# Patient Record
Sex: Female | Born: 1975 | Race: White | Hispanic: No | Marital: Married | Smoking: Never smoker
Health system: Southern US, Community
[De-identification: ages and names within clinical notes are randomized; demographics above are authoritative.]

## PROBLEM LIST (undated history)

## (undated) DIAGNOSIS — B019 Varicella without complication: Secondary | ICD-10-CM

## (undated) HISTORY — PX: AUGMENTATION MAMMAPLASTY: SUR837

## (undated) HISTORY — DX: Varicella without complication: B01.9

---

## 2004-09-12 HISTORY — PX: DEBRIDEMENT AND CLOSURE WOUND: SHX5614

## 2005-07-13 HISTORY — PX: DILATION AND CURETTAGE OF UTERUS: SHX78

## 2005-09-12 HISTORY — PX: TUBAL LIGATION: SHX77

## 2009-09-12 HISTORY — PX: BREAST SURGERY: SHX581

## 2017-02-22 DIAGNOSIS — N920 Excessive and frequent menstruation with regular cycle: Secondary | ICD-10-CM | POA: Diagnosis not present

## 2017-02-22 DIAGNOSIS — Z01419 Encounter for gynecological examination (general) (routine) without abnormal findings: Secondary | ICD-10-CM | POA: Diagnosis not present

## 2018-02-01 DIAGNOSIS — K080 Exfoliation of teeth due to systemic causes: Secondary | ICD-10-CM | POA: Diagnosis not present

## 2018-04-24 ENCOUNTER — Encounter: Payer: Self-pay | Admitting: Physician Assistant

## 2018-04-24 ENCOUNTER — Other Ambulatory Visit: Payer: Self-pay

## 2018-04-24 ENCOUNTER — Ambulatory Visit (INDEPENDENT_AMBULATORY_CARE_PROVIDER_SITE_OTHER): Payer: Federal, State, Local not specified - PPO | Admitting: Physician Assistant

## 2018-04-24 VITALS — BP 102/72 | HR 61 | Temp 98.0°F | Resp 16 | Ht 62.0 in | Wt 92.0 lb

## 2018-04-24 DIAGNOSIS — Z0000 Encounter for general adult medical examination without abnormal findings: Secondary | ICD-10-CM

## 2018-04-24 DIAGNOSIS — Z1231 Encounter for screening mammogram for malignant neoplasm of breast: Secondary | ICD-10-CM

## 2018-04-24 DIAGNOSIS — Z1239 Encounter for other screening for malignant neoplasm of breast: Secondary | ICD-10-CM

## 2018-04-24 LAB — LIPID PANEL
CHOL/HDL RATIO: 3
Cholesterol: 157 mg/dL (ref 0–200)
HDL: 56 mg/dL (ref >39.00)
LDL CALC: 90 mg/dL (ref 0–99)
NONHDL: 100.97
TRIGLYCERIDES: 56 mg/dL (ref 0.0–149.0)
VLDL: 11.2 mg/dL (ref 0.0–40.0)

## 2018-04-24 LAB — CBC WITH DIFFERENTIAL/PLATELET
BASOS ABS: 0.1 10*3/uL (ref 0.0–0.1)
BASOS PCT: 0.9 % (ref 0.0–3.0)
Eosinophils Absolute: 0.1 10*3/uL (ref 0.0–0.7)
Eosinophils Relative: 1 % (ref 0.0–5.0)
HEMATOCRIT: 39.1 % (ref 36.0–46.0)
Hemoglobin: 13.4 g/dL (ref 12.0–15.0)
LYMPHS ABS: 1.9 10*3/uL (ref 0.7–4.0)
Lymphocytes Relative: 31.7 % (ref 12.0–46.0)
MCHC: 34.3 g/dL (ref 30.0–36.0)
MCV: 84.8 fl (ref 78.0–100.0)
MONOS PCT: 6.6 % (ref 3.0–12.0)
Monocytes Absolute: 0.4 10*3/uL (ref 0.1–1.0)
NEUTROS PCT: 59.8 % (ref 43.0–77.0)
Neutro Abs: 3.5 10*3/uL (ref 1.4–7.7)
Platelets: 271 10*3/uL (ref 150.0–400.0)
RBC: 4.61 Mil/uL (ref 3.87–5.11)
RDW: 13.3 % (ref 11.5–15.5)
WBC: 5.9 10*3/uL (ref 4.0–10.5)

## 2018-04-24 LAB — COMPREHENSIVE METABOLIC PANEL
ALT: 10 U/L (ref 0–35)
AST: 13 U/L (ref 0–37)
Albumin: 4.9 g/dL (ref 3.5–5.2)
Alkaline Phosphatase: 36 U/L — ABNORMAL LOW (ref 39–117)
BILIRUBIN TOTAL: 0.7 mg/dL (ref 0.2–1.2)
BUN: 15 mg/dL (ref 6–23)
CALCIUM: 10.1 mg/dL (ref 8.4–10.5)
CO2: 29 meq/L (ref 19–32)
Chloride: 103 mEq/L (ref 96–112)
Creatinine, Ser: 0.73 mg/dL (ref 0.40–1.20)
GFR: 92.78 mL/min (ref >60.00)
GLUCOSE: 92 mg/dL (ref 70–99)
POTASSIUM: 4 meq/L (ref 3.5–5.1)
Sodium: 138 mEq/L (ref 135–145)
Total Protein: 7.2 g/dL (ref 6.0–8.3)

## 2018-04-24 NOTE — Progress Notes (Signed)
Patient presents to clinic today to establish care. Is overdue for physical. Endorses well-balanced diet overall. Keeps active. Denies acute concerns today.   Health Maintenance: Immunizations -- up-to-date.  Mammogram -- Overdue. Asymptomatic. Has implants.  PAP -- 01/2017. Normal per patient.    Past Medical History:  Diagnosis Date  . Chicken pox     Past Surgical History:  Procedure Laterality Date  . BREAST SURGERY  2011   Augmentation  . CESAREAN SECTION  2007  . DEBRIDEMENT AND CLOSURE WOUND  2006  . TUBAL LIGATION  2007    No current outpatient medications on file prior to visit.   No current facility-administered medications on file prior to visit.     Allergies  Allergen Reactions  . Penicillins Hives  . Sulfa Antibiotics Hives    Family History  Problem Relation Age of Onset  . Heart attack Father   . Hyperlipidemia Sister   . Hypertension Sister   . Cancer Maternal Grandmother   . Cancer Maternal Grandfather   . Hearing loss Paternal Grandmother   . Heart attack Paternal Grandmother   . Hearing loss Paternal Grandfather   . Heart attack Paternal Grandfather     Social History   Socioeconomic History  . Marital status: Married    Spouse name: Not on file  . Number of children: Not on file  . Years of education: Not on file  . Highest education level: Not on file  Occupational History  . Not on file  Social Needs  . Financial resource strain: Not on file  . Food insecurity:    Worry: Not on file    Inability: Not on file  . Transportation needs:    Medical: Not on file    Non-medical: Not on file  Tobacco Use  . Smoking status: Never Smoker  . Smokeless tobacco: Never Used  Substance and Sexual Activity  . Alcohol use: Never    Frequency: Never  . Drug use: Never  . Sexual activity: Yes  Lifestyle  . Physical activity:    Days per week: Not on file    Minutes per session: Not on file  . Stress: Not on file  Relationships  .  Social connections:    Talks on phone: Not on file    Gets together: Not on file    Attends religious service: Not on file    Active member of club or organization: Not on file    Attends meetings of clubs or organizations: Not on file    Relationship status: Not on file  . Intimate partner violence:    Fear of current or ex partner: Not on file    Emotionally abused: Not on file    Physically abused: Not on file    Forced sexual activity: Not on file  Other Topics Concern  . Not on file  Social History Narrative  . Not on file   Review of Systems  Constitutional: Negative for fever and weight loss.  HENT: Negative for ear discharge, ear pain, hearing loss and tinnitus.   Eyes: Negative for blurred vision, double vision, photophobia and pain.  Respiratory: Negative for cough and shortness of breath.   Cardiovascular: Negative for chest pain and palpitations.  Gastrointestinal: Negative for abdominal pain, blood in stool, constipation, diarrhea, heartburn, melena, nausea and vomiting.  Genitourinary: Negative for dysuria, flank pain, frequency, hematuria and urgency.  Musculoskeletal: Negative for falls.  Neurological: Negative for dizziness, loss of consciousness and headaches.  Endo/Heme/Allergies: Negative  for environmental allergies.  Psychiatric/Behavioral: Negative for depression, hallucinations, substance abuse and suicidal ideas. The patient is not nervous/anxious and does not have insomnia.    BP 102/72   Pulse 61   Temp 98 F (36.7 C) (Oral)   Resp 16   Ht 5\' 2"  (1.575 m)   Wt 92 lb (41.7 kg)   LMP 04/03/2018   SpO2 99%   BMI 16.83 kg/m   Physical Exam  Constitutional: She is oriented to person, place, and time. She appears well-developed and well-nourished.  HENT:  Head: Normocephalic and atraumatic.  Right Ear: Tympanic membrane, external ear and ear canal normal.  Left Ear: Tympanic membrane, external ear and ear canal normal.  Nose: Nose normal. No mucosal  edema.  Mouth/Throat: Uvula is midline, oropharynx is clear and moist and mucous membranes are normal. No oropharyngeal exudate or posterior oropharyngeal erythema.  Eyes: Pupils are equal, round, and reactive to light. Conjunctivae and EOM are normal.  Neck: Neck supple. No thyromegaly present.  Cardiovascular: Normal rate, regular rhythm, normal heart sounds and intact distal pulses.  Pulmonary/Chest: Effort normal and breath sounds normal. No respiratory distress. She has no wheezes. She has no rales.  Abdominal: Soft. Bowel sounds are normal. She exhibits no distension and no mass. There is no tenderness. There is no rebound and no guarding.  Lymphadenopathy:    She has no cervical adenopathy.  Neurological: She is alert and oriented to person, place, and time. No cranial nerve deficit. She exhibits normal muscle tone. Coordination normal.  Skin: Skin is warm and dry. No rash noted.  Vitals reviewed.  Assessment/Plan: 1. Visit for preventive health examination Depression screen negative. Health Maintenance reviewed. Preventive schedule discussed and handout given in AVS. Will obtain fasting labs today.  - CBC with Differential/Platelet - Comprehensive metabolic panel - Lipid panel  2. Breast cancer screening Order for screening mammogram placed.  - MM DIGITAL SCREENING BILATERAL; Future   Piedad ClimesWilliam Cody Devine Dant, PA-C

## 2018-04-24 NOTE — Patient Instructions (Signed)
Please go to the lab for blood work.   Our office will call you with your results unless you have chosen to receive results via MyChart.  If your blood work is normal we will follow-up each year for physicals and as scheduled for chronic medical problems.  If anything is abnormal we will treat accordingly and get you in for a follow-up.  Start a daily Turmeric supplement (500 mg) Consider getting a thumb spica splint/brace at [facility] or the pharmacy to wear on off days to help calm down inflammation. Let me know if things are not improving.   Preventive Care 40-64 Years, Female Preventive care refers to lifestyle choices and visits with your health care provider that can promote health and wellness. What does preventive care include?  A yearly physical exam. This is also called an annual well check.  Dental exams once or twice a year.  Routine eye exams. Ask your health care provider how often you should have your eyes checked.  Personal lifestyle choices, including: ? Daily care of your teeth and gums. ? Regular physical activity. ? Eating a healthy diet. ? Avoiding tobacco and drug use. ? Limiting alcohol use. ? Practicing safe sex. ? Taking low-dose aspirin daily starting at age 42. ? Taking vitamin and mineral supplements as recommended by your health care provider. What happens during an annual well check? The services and screenings done by your health care provider during your annual well check will depend on your age, overall health, lifestyle risk factors, and family history of disease. Counseling Your health care provider may ask you questions about your:  Alcohol use.  Tobacco use.  Drug use.  Emotional well-being.  Home and relationship well-being.  Sexual activity.  Eating habits.  Work and work Statistician.  Method of birth control.  Menstrual cycle.  Pregnancy history.  Screening You may have the following tests or measurements:  Height,  weight, and BMI.  Blood pressure.  Lipid and cholesterol levels. These may be checked every 5 years, or more frequently if you are over 37 years old.  Skin check.  Lung cancer screening. You may have this screening every year starting at age 35 if you have a 30-pack-year history of smoking and currently smoke or have quit within the past 15 years.  Fecal occult blood test (FOBT) of the stool. You may have this test every year starting at age 60.  Flexible sigmoidoscopy or colonoscopy. You may have a sigmoidoscopy every 5 years or a colonoscopy every 10 years starting at age 38.  Hepatitis C blood test.  Hepatitis B blood test.  Sexually transmitted disease (STD) testing.  Diabetes screening. This is done by checking your blood sugar (glucose) after you have not eaten for a while (fasting). You may have this done every 1-3 years.  Mammogram. This may be done every 1-2 years. Talk to your health care provider about when you should start having regular mammograms. This may depend on whether you have a family history of breast cancer.  BRCA-related cancer screening. This may be done if you have a family history of breast, ovarian, tubal, or peritoneal cancers.  Pelvic exam and Pap test. This may be done every 3 years starting at age 64. Starting at age 35, this may be done every 5 years if you have a Pap test in combination with an HPV test.  Bone density scan. This is done to screen for osteoporosis. You may have this scan if you are at high risk  for osteoporosis.  Discuss your test results, treatment options, and if necessary, the need for more tests with your health care provider. Vaccines Your health care provider may recommend certain vaccines, such as:  Influenza vaccine. This is recommended every year.  Tetanus, diphtheria, and acellular pertussis (Tdap, Td) vaccine. You may need a Td booster every 10 years.  Varicella vaccine. You may need this if you have not been  vaccinated.  Zoster vaccine. You may need this after age 59.  Measles, mumps, and rubella (MMR) vaccine. You may need at least one dose of MMR if you were born in 1957 or later. You may also need a second dose.  Pneumococcal 13-valent conjugate (PCV13) vaccine. You may need this if you have certain conditions and were not previously vaccinated.  Pneumococcal polysaccharide (PPSV23) vaccine. You may need one or two doses if you smoke cigarettes or if you have certain conditions.  Meningococcal vaccine. You may need this if you have certain conditions.  Hepatitis A vaccine. You may need this if you have certain conditions or if you travel or work in places where you may be exposed to hepatitis A.  Hepatitis B vaccine. You may need this if you have certain conditions or if you travel or work in places where you may be exposed to hepatitis B.  Haemophilus influenzae type b (Hib) vaccine. You may need this if you have certain conditions.  Talk to your health care provider about which screenings and vaccines you need and how often you need them. This information is not intended to replace advice given to you by your health care provider. Make sure you discuss any questions you have with your health care provider. Document Released: 09/25/2015 Document Revised: 05/18/2016 Document Reviewed: 06/30/2015 Elsevier Interactive Patient Education  [employer].  .

## 2018-04-25 ENCOUNTER — Encounter: Payer: Self-pay | Admitting: Emergency Medicine

## 2018-05-24 ENCOUNTER — Ambulatory Visit
Admission: RE | Admit: 2018-05-24 | Discharge: 2018-05-24 | Disposition: A | Discharge status: Home / Self Care | Payer: Federal, State, Local not specified - PPO | Source: Ambulatory Visit | Attending: Physician Assistant | Admitting: Physician Assistant

## 2018-05-24 DIAGNOSIS — Z1231 Encounter for screening mammogram for malignant neoplasm of breast: Secondary | ICD-10-CM | POA: Diagnosis not present

## 2018-05-24 DIAGNOSIS — Z1239 Encounter for other screening for malignant neoplasm of breast: Secondary | ICD-10-CM

## 2018-05-28 ENCOUNTER — Other Ambulatory Visit: Payer: Self-pay | Admitting: Physician Assistant

## 2018-05-28 DIAGNOSIS — R928 Other abnormal and inconclusive findings on diagnostic imaging of breast: Secondary | ICD-10-CM

## 2018-06-06 ENCOUNTER — Other Ambulatory Visit: Payer: Self-pay | Admitting: Physician Assistant

## 2018-06-06 ENCOUNTER — Ambulatory Visit
Admission: RE | Admit: 2018-06-06 | Discharge: 2018-06-06 | Disposition: A | Discharge status: Home / Self Care | Payer: Federal, State, Local not specified - PPO | Source: Ambulatory Visit | Attending: Physician Assistant | Admitting: Physician Assistant

## 2018-06-06 DIAGNOSIS — R921 Mammographic calcification found on diagnostic imaging of breast: Secondary | ICD-10-CM | POA: Diagnosis not present

## 2018-06-06 DIAGNOSIS — R928 Other abnormal and inconclusive findings on diagnostic imaging of breast: Secondary | ICD-10-CM

## 2018-06-12 ENCOUNTER — Ambulatory Visit
Admission: RE | Admit: 2018-06-12 | Discharge: 2018-06-12 | Disposition: A | Discharge status: Home / Self Care | Payer: Federal, State, Local not specified - PPO | Source: Ambulatory Visit | Attending: Physician Assistant | Admitting: Physician Assistant

## 2018-06-12 DIAGNOSIS — R928 Other abnormal and inconclusive findings on diagnostic imaging of breast: Secondary | ICD-10-CM

## 2018-06-21 ENCOUNTER — Other Ambulatory Visit: Payer: Self-pay | Admitting: General Surgery

## 2018-06-21 DIAGNOSIS — Z9851 Tubal ligation status: Secondary | ICD-10-CM | POA: Diagnosis not present

## 2018-06-21 DIAGNOSIS — R928 Other abnormal and inconclusive findings on diagnostic imaging of breast: Secondary | ICD-10-CM

## 2018-06-21 DIAGNOSIS — Z9882 Breast implant status: Secondary | ICD-10-CM | POA: Diagnosis not present

## 2018-06-21 DIAGNOSIS — Z98891 History of uterine scar from previous surgery: Secondary | ICD-10-CM | POA: Diagnosis not present

## 2018-07-09 NOTE — Pre-Procedure Instructions (Signed)
Courtney Key  07/09/2018      [id number] - [place], [place] - [address] [place] [address] Phone: [phone] Fax: [phone]    Your procedure is scheduled on Mon. Nov. 4, 2019 from 7:30AM-8:30AM  Report to [facility] Admitting Entrance "A" at 5:30AM  Call this number if you have problems the morning of surgery:  [phone]   Remember:  Do not eat after midnight on Nov.3rd  You may drink clear liquids until 3 hours (4:30AM) prior to surgery.  Clear liquids allowed are:   Water and Gatorade    Take these medicines the morning of surgery with A SIP OF WATER: NONE  As of today, stop taking all Other Aspirin Products, Vitamins, Fish oils, and Herbal medications. Also stop all NSAIDS i.e. Advil, Ibuprofen, Motrin, Aleve, Anaprox, Naproxen, BC, Goody Powders, and all Supplements.    Do not wear jewelry, make-up or nail polish.  Do not wear lotions, powders, or perfumes, or deodorant.  Do not shave 48 hours prior to surgery.   Do not bring valuables to the hospital.  [facility] is not responsible for any belongings or valuables.  Contacts, dentures or bridgework may not be worn into surgery.  Leave your suitcase in the car.  After surgery it may be brought to your room.  For patients admitted to the hospital, discharge time will be determined by your treatment team.  Patients discharged the day of surgery will not be allowed to drive home.   Special instructions:   [facility]- Preparing For Surgery  Before surgery, you can play an important role. Because skin is not sterile, your skin needs to be as free of germs as possible. You can reduce the number of germs on your skin by washing with CHG (chlorahexidine gluconate) Soap before surgery.  CHG is an antiseptic cleaner which kills germs and bonds with the skin to continue killing germs even after washing.    Oral Hygiene is also important to reduce your risk of  infection.  Remember - BRUSH YOUR TEETH THE MORNING OF SURGERY WITH YOUR REGULAR TOOTHPASTE  Please do not use if you have an allergy to CHG or antibacterial soaps. If your skin becomes reddened/irritated stop using the CHG.  Do not shave (including legs and underarms) for at least 48 hours prior to first CHG shower. It is OK to shave your face.  Please follow these instructions carefully.   1. Shower the NIGHT BEFORE SURGERY and the MORNING OF SURGERY with CHG.   2. If you chose to wash your hair, wash your hair first as usual with your normal shampoo.  3. After you shampoo, rinse your hair and body thoroughly to remove the shampoo.  4. Use CHG as you would any other liquid soap. You can apply CHG directly to the skin and wash gently with a scrungie or a clean washcloth.   5. Apply the CHG Soap to your body ONLY FROM THE NECK DOWN.  Do not use on open wounds or open sores. Avoid contact with your eyes, ears, mouth and genitals (private parts). Wash Face and genitals (private parts)  with your normal soap.  6. Wash thoroughly, paying special attention to the area where your surgery will be performed.  7. Thoroughly rinse your body with warm water from the neck down.  8. DO NOT shower/wash with your normal soap after using and rinsing off the CHG Soap.  9. Pat yourself dry  with a CLEAN TOWEL.  10. Wear CLEAN PAJAMAS to bed the night before surgery, wear comfortable clothes the morning of surgery  11. Place CLEAN SHEETS on your bed the night of your first shower and DO NOT SLEEP WITH PETS.  Day of Surgery:  Do not apply any deodorants/lotions.  Please wear clean clothes to the hospital/surgery center.   Remember to brush your teeth WITH YOUR REGULAR TOOTHPASTE.  Please read over the following fact sheets that you were given. Pain Booklet, Coughing and Deep Breathing and Surgical Site Infection Prevention

## 2018-07-10 ENCOUNTER — Encounter (HOSPITAL_COMMUNITY): Payer: Self-pay

## 2018-07-10 ENCOUNTER — Other Ambulatory Visit: Payer: Self-pay

## 2018-07-10 ENCOUNTER — Encounter (HOSPITAL_COMMUNITY)
Admission: RE | Admit: 2018-07-10 | Discharge: 2018-07-10 | Disposition: A | Discharge status: Home / Self Care | Payer: Federal, State, Local not specified - PPO | Source: Ambulatory Visit | Attending: General Surgery | Admitting: General Surgery

## 2018-07-10 DIAGNOSIS — Z01812 Encounter for preprocedural laboratory examination: Secondary | ICD-10-CM | POA: Insufficient documentation

## 2018-07-10 LAB — CBC WITH DIFFERENTIAL/PLATELET
ABS IMMATURE GRANULOCYTES: 0.02 10*3/uL (ref 0.00–0.07)
BASOS PCT: 1 %
Basophils Absolute: 0.1 10*3/uL (ref 0.0–0.1)
Eosinophils Absolute: 0.1 10*3/uL (ref 0.0–0.5)
Eosinophils Relative: 1 %
HEMATOCRIT: 39.3 % (ref 36.0–46.0)
Hemoglobin: 12.5 g/dL (ref 12.0–15.0)
Immature Granulocytes: 0 %
LYMPHS ABS: 2.2 10*3/uL (ref 0.7–4.0)
Lymphocytes Relative: 29 %
MCH: 28 pg (ref 26.0–34.0)
MCHC: 31.8 g/dL (ref 30.0–36.0)
MCV: 87.9 fL (ref 80.0–100.0)
MONO ABS: 0.5 10*3/uL (ref 0.1–1.0)
MONOS PCT: 7 %
NEUTROS ABS: 4.7 10*3/uL (ref 1.7–7.7)
Neutrophils Relative %: 62 %
PLATELETS: 270 10*3/uL (ref 150–400)
RBC: 4.47 MIL/uL (ref 3.87–5.11)
RDW: 12.8 % (ref 11.5–15.5)
WBC: 7.6 10*3/uL (ref 4.0–10.5)
nRBC: 0 % (ref 0.0–0.2)

## 2018-07-10 LAB — COMPREHENSIVE METABOLIC PANEL
ALK PHOS: 41 U/L (ref 38–126)
ALT: 13 U/L (ref 0–44)
AST: 19 U/L (ref 15–41)
Albumin: 4.3 g/dL (ref 3.5–5.0)
Anion gap: 8 (ref 5–15)
BILIRUBIN TOTAL: 0.4 mg/dL (ref 0.3–1.2)
BUN: 9 mg/dL (ref 6–20)
CALCIUM: 9.5 mg/dL (ref 8.9–10.3)
CHLORIDE: 105 mmol/L (ref 98–111)
CO2: 26 mmol/L (ref 22–32)
Creatinine, Ser: 0.6 mg/dL (ref 0.44–1.00)
GFR calc Af Amer: >60 mL/min (ref >60)
GLUCOSE: 97 mg/dL (ref 70–99)
Potassium: 3.6 mmol/L (ref 3.5–5.1)
Sodium: 139 mmol/L (ref 135–145)
TOTAL PROTEIN: 6.8 g/dL (ref 6.5–8.1)

## 2018-07-10 NOTE — Progress Notes (Signed)
PCP - Marcelline Mates, PA-C Cardiologist - denies seeing cardiology  Chest x-ray - N/A EKG -N/A  Stress Test - denies  ECHO - denies Cardiac Cath - denies  Sleep Study - patient denies having had sleep study.  Aspirin Instructions: pt instructed to stop all ASA/NSAIDs 7 days prior to surgery  Anesthesia review: no  Patient denies shortness of breath, fever, cough and chest pain at PAT appointment   Patient verbalized understanding of instructions that were given to them at the PAT appointment. Patient was also instructed that they will need to review over the PAT instructions again at home before surgery.

## 2018-07-10 NOTE — Pre-Procedure Instructions (Addendum)
Courtney Key  07/10/2018      [id number] - [place], [place] - [address] [place] [address] Phone: [phone] Fax: [phone]    Your procedure is scheduled on Mon. Nov. 4, 2019.  Report to [facility] Admitting Entrance "A" at 5:30AM  Call this number if you have problems the morning of surgery:  [phone]   Remember:  Do not eat after midnight on Nov.3rd  You may drink clear liquids until 3 hours (4:30AM) prior to surgery.  Clear liquids allowed are:   Water, Carbonated beverages, Clear tea, BLACK coffee (no cream or sugar) and Gatorade.     As of today, stop taking all Other Aspirin Products, Vitamins, Fish oils, and Herbal medications. Also stop all NSAIDS i.e. Advil, Ibuprofen, Motrin, Aleve, Anaprox, Naproxen, BC, Goody Powders, and all Supplements.    Do not wear jewelry, make-up or nail polish.  Do not wear lotions, powders, or perfumes, or deodorant.  Do not shave 48 hours prior to surgery.   Do not bring valuables to the hospital.  [facility] is not responsible for any belongings or valuables.  Contacts, dentures or bridgework may not be worn into surgery.  Leave your suitcase in the car.  After surgery it may be brought to your room.  For patients admitted to the hospital, discharge time will be determined by your treatment team.  Patients discharged the day of surgery will not be allowed to drive home.   Special instructions:   [place]- Preparing For Surgery  Before surgery, you can play an important role. Because skin is not sterile, your skin needs to be as free of germs as possible. You can reduce the number of germs on your skin by washing with CHG (chlorahexidine gluconate) Soap before surgery.  CHG is an antiseptic cleaner which kills germs and bonds with the skin to continue killing germs even after washing.    Oral Hygiene is also important to reduce your risk of infection.  Remember -  BRUSH YOUR TEETH THE MORNING OF SURGERY WITH YOUR REGULAR TOOTHPASTE  Please do not use if you have an allergy to CHG or antibacterial soaps. If your skin becomes reddened/irritated stop using the CHG.  Do not shave (including legs and underarms) for at least 48 hours prior to first CHG shower. It is OK to shave your face.  Please follow these instructions carefully.   1. Shower the NIGHT BEFORE SURGERY and the MORNING OF SURGERY with CHG.   2. If you chose to wash your hair, wash your hair first as usual with your normal shampoo.  3. After you shampoo, rinse your hair and body thoroughly to remove the shampoo.  4. Use CHG as you would any other liquid soap. You can apply CHG directly to the skin and wash gently with a scrungie or a clean washcloth.   5. Apply the CHG Soap to your body ONLY FROM THE NECK DOWN.  Do not use on open wounds or open sores. Avoid contact with your eyes, ears, mouth and genitals (private parts). Wash Face and genitals (private parts)  with your normal soap.  6. Wash thoroughly, paying special attention to the area where your surgery will be performed.  7. Thoroughly rinse your body with warm water from the neck down.  8. DO NOT shower/wash with your normal soap after using and rinsing off the CHG Soap.  9. Pat yourself dry with a CLEAN TOWEL.  10. Wear CLEAN PAJAMAS to bed the night before surgery, wear comfortable clothes the morning of surgery  11. Place CLEAN SHEETS on your bed the night of your first shower and DO NOT SLEEP WITH PETS.  Day of Surgery:  Do not apply any deodorants/lotions.  Please wear clean clothes to the hospital/surgery center.   Remember to brush your teeth WITH YOUR REGULAR TOOTHPASTE.  Please read over the following fact sheets that you were given. Pain Booklet, Coughing and Deep Breathing and Surgical Site Infection Prevention

## 2018-07-13 ENCOUNTER — Ambulatory Visit
Admission: RE | Admit: 2018-07-13 | Discharge: 2018-07-13 | Disposition: A | Discharge status: Home / Self Care | Payer: Federal, State, Local not specified - PPO | Source: Ambulatory Visit | Attending: General Surgery | Admitting: General Surgery

## 2018-07-13 DIAGNOSIS — R921 Mammographic calcification found on diagnostic imaging of breast: Secondary | ICD-10-CM | POA: Diagnosis not present

## 2018-07-13 DIAGNOSIS — R928 Other abnormal and inconclusive findings on diagnostic imaging of breast: Secondary | ICD-10-CM

## 2018-07-15 NOTE — Anesthesia Preprocedure Evaluation (Addendum)
Anesthesia Evaluation  Patient identified by MRN, date of birth, ID band Patient awake    Reviewed: Allergy & Precautions, H&P , NPO status , Patient's Chart, lab work & pertinent test results  Airway Mallampati: II  TM Distance: >3 FB Neck ROM: Full    Dental no notable dental hx. (+) Teeth Intact, Dental Advisory Given   Pulmonary neg pulmonary ROS,    Pulmonary exam normal breath sounds clear to auscultation       Cardiovascular Exercise Tolerance: Good negative cardio ROS   Rhythm:Regular Rate:Normal     Neuro/Psych negative neurological ROS  negative psych ROS   GI/Hepatic negative GI ROS, Neg liver ROS,   Endo/Other  negative endocrine ROS  Renal/GU negative Renal ROS  negative genitourinary   Musculoskeletal   Abdominal   Peds  Hematology negative hematology ROS (+)   Anesthesia Other Findings   Reproductive/Obstetrics negative OB ROS                            Anesthesia Physical Anesthesia Plan  ASA: I  Anesthesia Plan: General   Post-op Pain Management:    Induction: Intravenous  PONV Risk Score and Plan: 4 or greater and Ondansetron, Dexamethasone and Midazolam  Airway Management Planned: LMA  Additional Equipment:   Intra-op Plan:   Post-operative Plan: Extubation in OR  Informed Consent: I have reviewed the patients History and Physical, chart, labs and discussed the procedure including the risks, benefits and alternatives for the proposed anesthesia with the patient or authorized representative who has indicated his/her understanding and acceptance.   Dental advisory given  Plan Discussed with: CRNA  Anesthesia Plan Comments:         Anesthesia Quick Evaluation

## 2018-07-15 NOTE — H&P (Signed)
Courtney Key Location: [facility] Patient #: [id number] DOB: 04-May-1976 Married / Language: English / Race: White Female       History of Present Illness        This is a pleasant, healthy, 42 year old female from [place]. She is referred by Britta Mccreedy at the [facility] or calcifications left breast. Courtney Mates, PA provides primary care.      She has no prior history of breast problems. She does have a history of bilateral retropectoral implants. First ever screening mammogram showed 2 areas of calcification. In the left breast, 6 to 7 o'clock position there is a 1.1 cm area of suspicious microcalcifications. Also noted was some benign appearing subareolar calcifications which were felt to be appropriate for 6 month follow-up unless we find cancer in the lower inner quadrant. They were setting her up for stereo biopsy but could not do this due to the relatively thin breast tissue.      Past history reveals C-section 1. Multiparity. Augmentation mammoplasty in [place]. Bilateral tubal ligation. Family history reveals lung cancer in 2 grandparents. No breast or ovarian cancer. Mother and father living. Father has heart disease Social history reveals she is married with 4 children. Denies alcohol or tobacco. Lives in [place]. Works at the [place] [employer]. Her husband also is a Consulting civil engineer at another post office.      I told her that the calcifications in the lower inner quadrant of the left breast were suspicious but more likely they are benign. I told her that tissue diagnosis was recommended strongly. She agrees and she will be scheduled for left breast lumpectomy with radioactive seed localization. We'll use the cautery extensively to avoid injury to the implant. I discussed the indications, details, techniques, and numerous risk of the surgery with her. She is aware the risks of bleeding, infection, injury and rupture of the implant,  cosmetic deformity, nerve damage with chronic pain or numbness. Reoperation if cancer is found. She understands all of these issues. All her questions were answered. She agrees with this plan.    Past Surgical History Breast Augmentation  Bilateral. Cesarean Section - 1   Diagnostic Studies History  Colonoscopy  never Mammogram  within last year Pap Smear  1-5 years ago  Allergies  Penicillins  Sulfa Antibiotics  Allergies Reconciled   Medication History  No Current Medications Medications Reconciled  Social History  Alcohol use  Occasional alcohol use. Caffeine use  Carbonated beverages, Coffee. No drug use  Tobacco use  Never smoker.  Family History Heart Disease  Father.  Pregnancy / Birth History  Age at menarche  14 years. Contraceptive History  Oral contraceptives. Gravida  6 Maternal age  73-20 Para  4 Regular periods   Other Problems  No pertinent past medical history     Review of Systems  General Not Present- Appetite Loss, Chills, Fatigue, Fever, Night Sweats, Weight Gain and Weight Loss. Skin Not Present- Change in Wart/Mole, Dryness, Hives, Jaundice, New Lesions, Non-Healing Wounds, Rash and Ulcer. HEENT Not Present- Earache, Hearing Loss, Hoarseness, Nose Bleed, Oral Ulcers, Ringing in the Ears, Seasonal Allergies, Sinus Pain, Sore Throat, Visual Disturbances, Wears glasses/contact lenses and Yellow Eyes. Respiratory Not Present- Bloody sputum, Chronic Cough, Difficulty Breathing, Snoring and Wheezing. Breast Present- Breast Mass. Not Present- Breast Pain, Nipple Discharge and Skin Changes. Cardiovascular Not Present- Chest Pain, Difficulty Breathing Lying Down, Leg Cramps, Palpitations, Rapid Heart Rate, Shortness of Breath and Swelling of Extremities. Gastrointestinal Not Present-  Abdominal Pain, Bloating, Bloody Stool, Change in Bowel Habits, Chronic diarrhea, Constipation, Difficulty Swallowing, Excessive gas, Gets full  quickly at meals, Hemorrhoids, Indigestion, Nausea, Rectal Pain and Vomiting. Female Genitourinary Not Present- Frequency, Nocturia, Painful Urination, Pelvic Pain and Urgency. Musculoskeletal Not Present- Back Pain, Joint Pain, Joint Stiffness, Muscle Pain, Muscle Weakness and Swelling of Extremities. Neurological Not Present- Decreased Memory, Fainting, Headaches, Numbness, Seizures, Tingling, Tremor, Trouble walking and Weakness. Psychiatric Not Present- Anxiety, Bipolar, Change in Sleep Pattern, Depression, Fearful and Frequent crying. Endocrine Not Present- Cold Intolerance, Excessive Hunger, Hair Changes, Heat Intolerance, Hot flashes and New Diabetes. Hematology Present- Easy Bruising. Not Present- Blood Thinners, Excessive bleeding, Gland problems, HIV and Persistent Infections.  Vitals  Weight: 92.8 lb Height: 62in Body Surface Area: 1.38 m Body Mass Index: 16.97 kg/m  Temp.: 98.18F  Pulse: 96 (Regular)  BP: 112/72 (Sitting, Left Arm, Standard)       Physical Exam  General Mental Status-Alert. General Appearance-Consistent with stated age. Hydration-Well hydrated. Voice-Normal. Note: BMI 16.9   Head and Neck Head-normocephalic, atraumatic with no lesions or palpable masses. Trachea-midline. Thyroid Gland Characteristics - normal size and consistency.  Eye Eyeball - Bilateral-Extraocular movements intact. Sclera/Conjunctiva - Bilateral-No scleral icterus.  Chest and Lung Exam Chest and lung exam reveals -quiet, even and easy respiratory effort with no use of accessory muscles and on auscultation, normal breath sounds, no adventitious sounds and normal vocal resonance. Inspection Chest Wall - Normal. Back - normal.  Breast Breast - Left-Symmetric, Non Tender, No Biopsy scars, no Dimpling, No Inflammation, No Lumpectomy scars, No Mastectomy scars, No Peau d' Orange. Breast - Right-Symmetric, Non Tender, No Biopsy scars, no  Dimpling, No Inflammation, No Lumpectomy scars, No Mastectomy scars, No Peau d' Orange. Breast Lump-No Palpable Breast Mass. Note: Breasts are soft. Inframammary incisions well-healed. Implants palpable. No mass or skin change or adenopathy on either side.   Cardiovascular Cardiovascular examination reveals -normal heart sounds, regular rate and rhythm with no murmurs and normal pedal pulses bilaterally.  Abdomen Inspection Inspection of the abdomen reveals - No Hernias. Skin - Scar - Note: Pfannenstiel incision. Palpation/Percussion Palpation and Percussion of the abdomen reveal - Soft, Non Tender, No Rebound tenderness, No Rigidity (guarding) and No hepatosplenomegaly. Auscultation Auscultation of the abdomen reveals - Bowel sounds normal.  Neurologic Neurologic evaluation reveals -alert and oriented x 3 with no impairment of recent or remote memory. Mental Status-Normal.  Musculoskeletal Normal Exam - Left-Upper Extremity Strength Normal and Lower Extremity Strength Normal. Normal Exam - Right-Upper Extremity Strength Normal and Lower Extremity Strength Normal.  Lymphatic Head & Neck  General Head & Neck Lymphatics: Bilateral - Description - Normal. Axillary  General Axillary Region: Bilateral - Description - Normal. Tenderness - Non Tender. Femoral & Inguinal  Generalized Femoral & Inguinal Lymphatics: Bilateral - Description - Normal. Tenderness - Non Tender.    Assessment & Plan  ABNORMALITY OF LEFT BREAST ON SCREENING MAMMOGRAM (R92.8)  Your recent screening mammogram show a suspicious area of calcifications in the left breast, then at the 6 to 7 o'clock position There is no palpable mass there The x-rays also showed what appears to be a benign area of calcifications behind the nipple They could not do image guided core needle biopsies because of the size of your breast tissue and the presence of the implants  The calcifications are suspicious enough  to warrant a surgical biopsy and you have agreed to that you will be scheduled for left breast lumpectomy with radioactive seed localization. I  have discussed the indications, techniques, and risk of the surgery in detail  HISTORY OF AUGMENTATION MAMMOPLASTY (Z98.82) HISTORY OF C-SECTION (Z61.096) HISTORY OF BILATERAL TUBAL LIGATION (Z98.51)    Angelia Mould. Derrell Lolling, M.D., [facility] General and Minimally invasive Surgery Breast and Colorectal Surgery Office:   [phone] Pager:   [phone]

## 2018-07-16 ENCOUNTER — Ambulatory Visit (HOSPITAL_COMMUNITY): Payer: Federal, State, Local not specified - PPO | Admitting: Anesthesiology

## 2018-07-16 ENCOUNTER — Encounter (HOSPITAL_COMMUNITY)
Admission: RE | Disposition: A | Discharge status: Home / Self Care | Payer: Self-pay | Source: Ambulatory Visit | Attending: General Surgery

## 2018-07-16 ENCOUNTER — Ambulatory Visit
Admission: RE | Admit: 2018-07-16 | Discharge: 2018-07-16 | Disposition: A | Discharge status: Home / Self Care | Payer: Federal, State, Local not specified - PPO | Source: Ambulatory Visit | Attending: General Surgery | Admitting: General Surgery

## 2018-07-16 ENCOUNTER — Encounter (HOSPITAL_COMMUNITY): Payer: Self-pay | Admitting: *Deleted

## 2018-07-16 ENCOUNTER — Ambulatory Visit (HOSPITAL_COMMUNITY)
Admission: RE | Admit: 2018-07-16 | Discharge: 2018-07-16 | Disposition: A | Discharge status: Home / Self Care | Payer: Federal, State, Local not specified - PPO | Source: Ambulatory Visit | Attending: General Surgery | Admitting: General Surgery

## 2018-07-16 DIAGNOSIS — N6489 Other specified disorders of breast: Secondary | ICD-10-CM | POA: Diagnosis not present

## 2018-07-16 DIAGNOSIS — Z880 Allergy status to penicillin: Secondary | ICD-10-CM | POA: Diagnosis not present

## 2018-07-16 DIAGNOSIS — N6012 Diffuse cystic mastopathy of left breast: Secondary | ICD-10-CM | POA: Diagnosis not present

## 2018-07-16 DIAGNOSIS — Z9882 Breast implant status: Secondary | ICD-10-CM | POA: Insufficient documentation

## 2018-07-16 DIAGNOSIS — R928 Other abnormal and inconclusive findings on diagnostic imaging of breast: Secondary | ICD-10-CM | POA: Diagnosis present

## 2018-07-16 DIAGNOSIS — Z882 Allergy status to sulfonamides status: Secondary | ICD-10-CM | POA: Diagnosis not present

## 2018-07-16 DIAGNOSIS — R921 Mammographic calcification found on diagnostic imaging of breast: Secondary | ICD-10-CM | POA: Diagnosis not present

## 2018-07-16 DIAGNOSIS — D242 Benign neoplasm of left breast: Secondary | ICD-10-CM | POA: Diagnosis not present

## 2018-07-16 HISTORY — PX: BREAST LUMPECTOMY WITH RADIOACTIVE SEED LOCALIZATION: SHX6424

## 2018-07-16 LAB — POCT PREGNANCY, URINE: Preg Test, Ur: NEGATIVE

## 2018-07-16 SURGERY — BREAST LUMPECTOMY WITH RADIOACTIVE SEED LOCALIZATION
Anesthesia: General | Site: Breast | Laterality: Left

## 2018-07-16 MED ORDER — GABAPENTIN 300 MG PO CAPS
300.0000 mg | ORAL_CAPSULE | ORAL | Status: AC
  Administered 2018-07-16: 300 mg via ORAL
  Filled 2018-07-16: qty 1

## 2018-07-16 MED ORDER — MIDAZOLAM HCL 2 MG/2ML IJ SOLN
INTRAMUSCULAR | Status: AC
  Filled 2018-07-16: qty 2

## 2018-07-16 MED ORDER — PROPOFOL 10 MG/ML IV BOLUS
INTRAVENOUS | Status: DC | PRN
  Administered 2018-07-16: 100 mg via INTRAVENOUS

## 2018-07-16 MED ORDER — PHENYLEPHRINE HCL 10 MG/ML IJ SOLN
INTRAMUSCULAR | Status: DC | PRN
  Administered 2018-07-16: 40 ug via INTRAVENOUS
  Administered 2018-07-16: 80 ug via INTRAVENOUS

## 2018-07-16 MED ORDER — CEFAZOLIN SODIUM-DEXTROSE 2-4 GM/100ML-% IV SOLN
2.0000 g | INTRAVENOUS | Status: AC
  Administered 2018-07-16: 2 g via INTRAVENOUS

## 2018-07-16 MED ORDER — FENTANYL CITRATE (PF) 250 MCG/5ML IJ SOLN
INTRAMUSCULAR | Status: AC
  Filled 2018-07-16: qty 5

## 2018-07-16 MED ORDER — HYDROCODONE-ACETAMINOPHEN 5-325 MG PO TABS: 1.0000 | ORAL_TABLET | Freq: Four times a day (QID) | ORAL | 0 refills | Status: DC | PRN

## 2018-07-16 MED ORDER — CHLORHEXIDINE GLUCONATE CLOTH 2 % EX PADS: 6.0000 | MEDICATED_PAD | Freq: Once | CUTANEOUS | Status: DC

## 2018-07-16 MED ORDER — ACETAMINOPHEN 500 MG PO TABS
1000.0000 mg | ORAL_TABLET | ORAL | Status: AC
  Administered 2018-07-16: 1000 mg via ORAL
  Filled 2018-07-16: qty 2

## 2018-07-16 MED ORDER — FENTANYL CITRATE (PF) 100 MCG/2ML IJ SOLN
INTRAMUSCULAR | Status: DC | PRN
  Administered 2018-07-16 (×2): 50 ug via INTRAVENOUS

## 2018-07-16 MED ORDER — BUPIVACAINE-EPINEPHRINE 0.25% -1:200000 IJ SOLN
INTRAMUSCULAR | Status: DC | PRN
  Administered 2018-07-16: 4 mL

## 2018-07-16 MED ORDER — PROPOFOL 10 MG/ML IV BOLUS
INTRAVENOUS | Status: AC
  Filled 2018-07-16: qty 40

## 2018-07-16 MED ORDER — BUPIVACAINE-EPINEPHRINE (PF) 0.25% -1:200000 IJ SOLN
INTRAMUSCULAR | Status: AC
  Filled 2018-07-16: qty 30

## 2018-07-16 MED ORDER — LACTATED RINGERS IV SOLN
INTRAVENOUS | Status: DC | PRN
  Administered 2018-07-16: 07:00:00 via INTRAVENOUS

## 2018-07-16 MED ORDER — HYDROMORPHONE HCL 1 MG/ML IJ SOLN: 0.2500 mg | INTRAMUSCULAR | Status: DC | PRN

## 2018-07-16 MED ORDER — ONDANSETRON HCL 4 MG/2ML IJ SOLN
INTRAMUSCULAR | Status: DC | PRN
  Administered 2018-07-16: 4 mg via INTRAVENOUS

## 2018-07-16 MED ORDER — LIDOCAINE 2% (20 MG/ML) 5 ML SYRINGE
INTRAMUSCULAR | Status: DC | PRN
  Administered 2018-07-16: 60 mg via INTRAVENOUS

## 2018-07-16 MED ORDER — MIDAZOLAM HCL 5 MG/5ML IJ SOLN
INTRAMUSCULAR | Status: DC | PRN
  Administered 2018-07-16: 2 mg via INTRAVENOUS

## 2018-07-16 MED ORDER — 0.9 % SODIUM CHLORIDE (POUR BTL) OPTIME
TOPICAL | Status: DC | PRN
  Administered 2018-07-16: 1000 mL

## 2018-07-16 SURGICAL SUPPLY — 44 items
APPLIER CLIP 9.375 MED OPEN (MISCELLANEOUS) ×2
BINDER BREAST LRG (GAUZE/BANDAGES/DRESSINGS) IMPLANT
BINDER BREAST XLRG (GAUZE/BANDAGES/DRESSINGS) IMPLANT
BLADE SURG 15 STRL LF DISP TIS (BLADE) ×1 IMPLANT
BLADE SURG 15 STRL SS (BLADE) ×1
CANISTER SUCT 3000ML PPV (MISCELLANEOUS) ×2 IMPLANT
CHLORAPREP W/TINT 26ML (MISCELLANEOUS) ×2 IMPLANT
CLIP APPLIE 9.375 MED OPEN (MISCELLANEOUS) ×1 IMPLANT
COVER PROBE W GEL 5X96 (DRAPES) ×2 IMPLANT
COVER SURGICAL LIGHT HANDLE (MISCELLANEOUS) ×2 IMPLANT
COVER WAND RF STERILE (DRAPES) IMPLANT
DERMABOND ADVANCED (GAUZE/BANDAGES/DRESSINGS) ×1
DERMABOND ADVANCED .7 DNX12 (GAUZE/BANDAGES/DRESSINGS) ×1 IMPLANT
DEVICE DUBIN SPECIMEN MAMMOGRA (MISCELLANEOUS) ×2 IMPLANT
DRAPE CHEST BREAST 15X10 FENES (DRAPES) ×2 IMPLANT
DRAPE UTILITY XL STRL (DRAPES) ×2 IMPLANT
DRSG PAD ABDOMINAL 8X10 ST (GAUZE/BANDAGES/DRESSINGS) ×2 IMPLANT
ELECT CAUTERY BLADE 6.4 (BLADE) ×2 IMPLANT
ELECT REM PT RETURN 9FT ADLT (ELECTROSURGICAL) ×2
ELECTRODE REM PT RTRN 9FT ADLT (ELECTROSURGICAL) ×1 IMPLANT
GAUZE SPONGE 4X4 12PLY STRL LF (GAUZE/BANDAGES/DRESSINGS) ×2 IMPLANT
GLOVE EUDERMIC 7 POWDERFREE (GLOVE) ×4 IMPLANT
GOWN STRL REUS W/ TWL LRG LVL3 (GOWN DISPOSABLE) ×1 IMPLANT
GOWN STRL REUS W/ TWL XL LVL3 (GOWN DISPOSABLE) ×1 IMPLANT
GOWN STRL REUS W/TWL LRG LVL3 (GOWN DISPOSABLE) ×1
GOWN STRL REUS W/TWL XL LVL3 (GOWN DISPOSABLE) ×1
ILLUMINATOR WAVEGUIDE N/F (MISCELLANEOUS) IMPLANT
KIT BASIN OR (CUSTOM PROCEDURE TRAY) ×2 IMPLANT
KIT MARKER MARGIN INK (KITS) ×2 IMPLANT
LIGHT WAVEGUIDE WIDE FLAT (MISCELLANEOUS) IMPLANT
NEEDLE HYPO 25GX1X1/2 BEV (NEEDLE) ×2 IMPLANT
NS IRRIG 1000ML POUR BTL (IV SOLUTION) ×2 IMPLANT
PACK SURGICAL SETUP 50X90 (CUSTOM PROCEDURE TRAY) ×2 IMPLANT
PENCIL BUTTON HOLSTER BLD 10FT (ELECTRODE) ×2 IMPLANT
SPONGE LAP 4X18 RFD (DISPOSABLE) ×2 IMPLANT
SUT MNCRL AB 4-0 PS2 18 (SUTURE) ×2 IMPLANT
SUT SILK 2 0 SH (SUTURE) ×2 IMPLANT
SUT VIC AB 3-0 SH 18 (SUTURE) ×2 IMPLANT
SYR BULB 3OZ (MISCELLANEOUS) ×2 IMPLANT
SYR CONTROL 10ML LL (SYRINGE) ×2 IMPLANT
TOWEL OR 17X24 6PK STRL BLUE (TOWEL DISPOSABLE) ×2 IMPLANT
TOWEL OR 17X26 10 PK STRL BLUE (TOWEL DISPOSABLE) ×2 IMPLANT
TUBE CONNECTING 12X1/4 (SUCTIONS) ×2 IMPLANT
YANKAUER SUCT BULB TIP NO VENT (SUCTIONS) ×2 IMPLANT

## 2018-07-16 NOTE — Op Note (Signed)
Patient Name:           Courtney Key   Date of Surgery:        07/16/2018  Pre op Diagnosis:      Focal calcifications left breast                                      History bilateral retropectoral implants  Post op Diagnosis:    Same  Procedure:                 Left breast lumpectomy with radioactive seed localization and margin assessment  Surgeon:                     Edsel Petrin. Dalbert Batman, M.D., FACS  Assistant:                      Or staff  Operative Indications:   This is a pleasant, healthy, 42 year old female from [place]. She is referred by Curlene Dolphin at the [facility] or calcifications left breast. Raiford Noble, PA provides primary care.      She has no prior history of breast problems. She does have a history of bilateral retropectoral implants. First ever screening mammogram showed 2 areas of calcification. In the left breast, 6 to 7 o'clock position there is a 1.1 cm area of suspicious microcalcifications. Also noted was some benign appearing subareolar calcifications which were felt to be appropriate for 6 month follow-up unless we find cancer in the lower inner quadrant. They were setting her up for stereo biopsy but could not do this due to the relatively thin breast tissue.      I told her that the calcifications in the lower inner quadrant of the left breast were suspicious but more likely they are benign. I told her that tissue diagnosis was recommended strongly. She agrees and she will be scheduled for left breast lumpectomy with radioactive seed localization.   Operative Findings:       The radioactive seed was in the left breast, lower inner quadrant, a couple of centimeters peripheral to the areolar margin.  As noted on the imaging studies the seed was at the anterior margin of a 7 mm area of microcalcifications.  The seed was encountered immediately after making the incision but we were able to do a conservative lumpectomy and get all of the involved  tissue out.  The specimen mammogram looked good and contained the seed.  Procedure in Detail:          Following the induction of general LMA anesthesia the patient's left breast was prepped and draped in a sterile fashion.  Surgical timeout was performed.  Intravenous antibiotics were given.  0.5% Marcaine with epinephrine was used as local infiltration anesthetic, very superficially and very carefully injecting tangentially.      Using the neoprobe isolated the radioactive seed.  A curvilinear circumareolar incision was made in the lower inner quadrant.  The seed was encountered in the superficial tissues but was preserved.  Sutures were placed in the breast tissue on either side of the seed to elevate this and the lumpectomy was performed using electrocautery.  The specimen was marked with silk sutures and a 6 color ink kit to orient the pathologist.  Specimen mammogram looked good.  The specimen was sent to the lab where the seed was retrieved.  The  wound was irrigated.  Hemostasis excellent.  4 metal marker clips were placed in the superior medial lateral and inferior walls of the lumpectomy cavity.  I chose not to place a marker clip in the deep margin.  Breast tissue was reapproximated with interrupted 3-0 Vicryl and the skin closed with a running subcuticular 4-0 Monocryl and Dermabond.  The patient tolerated the procedure well and was taken to PACU in stable condition.  EBL less than 10 cc.  Counts correct.  Complications none.    Addendum: I logged on to the [place]MP Aware website and reviewed her prescription medication history     Jiah Bari M. Dalbert Batman, M.D., FACS General and Minimally Invasive Surgery Breast and Colorectal Surgery  07/16/2018 8:20 AM

## 2018-07-16 NOTE — Interval H&P Note (Signed)
History and Physical Interval Note:  07/16/2018 5:55 AM  Courtney Key  has presented today for surgery, with the diagnosis of abnormality left breast on screening mammogram  The various methods of treatment have been discussed with the patient and family. After consideration of risks, benefits and other options for treatment, the patient has consented to  Procedure(s): LEFT BREAST LUMPECTOMY WITH RADIOACTIVE SEED LOCALIZATION (Left) as a surgical intervention .  The patient's history has been reviewed, patient examined, no change in status, stable for surgery.  I have reviewed the patient's chart and labs.  Questions were answered to the patient's satisfaction.     Ernestene Mention

## 2018-07-16 NOTE — Anesthesia Postprocedure Evaluation (Signed)
Anesthesia Post Note  Patient: Courtney Key  Procedure(s) Performed: LEFT BREAST LUMPECTOMY WITH RADIOACTIVE SEED LOCALIZATION (Left Breast)     Patient location during evaluation: PACU Anesthesia Type: General Level of consciousness: awake and alert Pain management: pain level controlled Vital Signs Assessment: post-procedure vital signs reviewed and stable Respiratory status: spontaneous breathing, nonlabored ventilation and respiratory function stable Cardiovascular status: blood pressure returned to baseline and stable Postop Assessment: no apparent nausea or vomiting Anesthetic complications: no    Last Vitals:  Vitals:   07/16/18 0836 07/16/18 0851  BP: 93/69 92/62  Pulse: (!) 59 71  Resp: 10 11  Temp:  36.6 C  SpO2: 100% 100%    Last Pain:  Vitals:   07/16/18 0850  TempSrc:   PainSc: 0-No pain                 Troyce Gieske,W. EDMOND

## 2018-07-16 NOTE — Anesthesia Procedure Notes (Addendum)
Procedure Name: LMA Insertion Date/Time: 07/16/2018 7:36 AM Performed by: White, Cordella Register, CRNA Pre-anesthesia Checklist: Patient identified, Emergency Drugs available, Suction available and Patient being monitored Patient Re-evaluated:Patient Re-evaluated prior to induction Oxygen Delivery Method: Circle System Utilized Preoxygenation: Pre-oxygenation with 100% oxygen Induction Type: IV induction Ventilation: Mask ventilation without difficulty LMA: LMA inserted LMA Size: 3.0 Number of attempts: 1 Placement Confirmation: positive ETCO2 and breath sounds checked- equal and bilateral Tube secured with: Tape Dental Injury: Teeth and Oropharynx as per pre-operative assessment  Comments: LMA inserted by Jeanine Luz, SRNA.

## 2018-07-16 NOTE — Transfer of Care (Signed)
Immediate Anesthesia Transfer of Care Note  Patient: Courtney Key  Procedure(s) Performed: LEFT BREAST LUMPECTOMY WITH RADIOACTIVE SEED LOCALIZATION (Left Breast)  Patient Location: PACU  Anesthesia Type:General  Level of Consciousness: awake, alert  and oriented  Airway & Oxygen Therapy: Patient Spontanous Breathing and Patient connected to face mask oxygen  Post-op Assessment: Report given to RN and Post -op Vital signs reviewed and stable  Post vital signs: Reviewed and stable  Last Vitals:  Vitals Value Taken Time  BP 107/77 07/16/2018  8:21 AM  Temp    Pulse 98 07/16/2018  8:21 AM  Resp 14 07/16/2018  8:21 AM  SpO2 100 % 07/16/2018  8:21 AM  Vitals shown include unvalidated device data.  Last Pain:  Vitals:   07/16/18 0610  TempSrc: Oral      Patients Stated Pain Goal: 7 (07/16/18 4098)  Complications: No apparent anesthesia complications

## 2018-07-16 NOTE — Discharge Instructions (Signed)
[facility] °Office Phone Number [phone] ° °BREAST BIOPSY/ PARTIAL MASTECTOMY: POST OP INSTRUCTIONS ° °Always review your discharge instruction sheet given to you by the facility where your surgery was performed. ° °IF YOU HAVE DISABILITY OR FAMILY LEAVE FORMS, YOU MUST BRING THEM TO THE OFFICE FOR PROCESSING.  DO NOT GIVE THEM TO YOUR DOCTOR. ° °1. A prescription for pain medication may be given to you upon discharge.  Take your pain medication as prescribed, if needed.  If narcotic pain medicine is not needed, then you may take acetaminophen (Tylenol) or ibuprofen (Advil) as needed. °2. Take your usually prescribed medications unless otherwise directed °3. If you need a refill on your pain medication, please contact your pharmacy.  They will contact our office to request authorization.  Prescriptions will not be filled after 5pm or on week-ends. °4. You should eat very light the first 24 hours after surgery, such as soup, crackers, pudding, etc.  Resume your normal diet the day after surgery. °5. Most patients will experience some swelling and bruising in the breast.  Ice packs and a good support bra will help.  Swelling and bruising can take several days to resolve.  °6. It is common to experience some constipation if taking pain medication after surgery.  Increasing fluid intake and taking a stool softener will usually help or prevent this problem from occurring.  A mild laxative (Milk of Magnesia or Miralax) should be taken according to package directions if there are no bowel movements after 48 hours. °7. Unless discharge instructions indicate otherwise, you may remove your bandages 24-48 hours after surgery, and you may shower at that time.  You may have steri-strips (small skin tapes) in place directly over the incision.  These strips should be left on the skin for 7-10 days.  If your surgeon used skin glue on the incision, you may shower in 24 hours.  The glue will flake off over the  next 2-3 weeks.  Any sutures or staples will be removed at the office during your follow-up visit. °8. ACTIVITIES:  You may resume regular daily activities (gradually increasing) beginning the next day.  Wearing a good support bra or sports bra minimizes pain and swelling.  You may have sexual intercourse when it is comfortable. °a. You may drive when you no longer are taking prescription pain medication, you can comfortably wear a seatbelt, and you can safely maneuver your car and apply brakes. °b. RETURN TO WORK:  ______________________________________________________________________________________ °9. You should see your doctor in the office for a follow-up appointment approximately two weeks after your surgery.  Your doctor’s nurse will typically make your follow-up appointment when she calls you with your pathology report.  Expect your pathology report 2-3 business days after your surgery.  You may call to check if you do not hear from us after three days. °10. OTHER INSTRUCTIONS: _______________________________________________________________________________________________ _____________________________________________________________________________________________________________________________________ °_____________________________________________________________________________________________________________________________________ °_____________________________________________________________________________________________________________________________________ ° °WHEN TO CALL YOUR DOCTOR: °1. Fever over 101.0 °2. Nausea and/or vomiting. °3. Extreme swelling or bruising. °4. Continued bleeding from incision. °5. Increased pain, redness, or drainage from the incision. ° °The clinic staff is available to answer your questions during regular business hours.  Please don’t hesitate to call and ask to speak to one of the nurses for clinical concerns.  If you have a medical emergency, go to the nearest  emergency room or call 911.  A surgeon from [facility] is always on call at the hospital. ° °For further questions, please visit [email or web address]  °

## 2018-07-17 ENCOUNTER — Encounter (HOSPITAL_COMMUNITY): Payer: Self-pay | Admitting: General Surgery

## 2018-07-18 NOTE — Progress Notes (Signed)
Inform patient of Pathology report,. Breast pathology completely benign. Complex sclerosing lesion. Nothing further to do surgically Will discuss in detail at next office visit. Let me know you reached her.  hmi

## 2018-09-21 ENCOUNTER — Encounter: Payer: Self-pay | Admitting: Physician Assistant

## 2018-09-21 ENCOUNTER — Ambulatory Visit: Payer: Federal, State, Local not specified - PPO | Admitting: Physician Assistant

## 2018-09-21 ENCOUNTER — Other Ambulatory Visit: Payer: Self-pay

## 2018-09-21 VITALS — BP 90/60 | HR 122 | Temp 98.8°F | Resp 16 | Ht 62.0 in | Wt 97.0 lb

## 2018-09-21 DIAGNOSIS — R52 Pain, unspecified: Secondary | ICD-10-CM

## 2018-09-21 DIAGNOSIS — R6889 Other general symptoms and signs: Secondary | ICD-10-CM

## 2018-09-21 LAB — POC INFLUENZA A&B (BINAX/QUICKVUE)
INFLUENZA A, POC: NEGATIVE
Influenza B, POC: NEGATIVE

## 2018-09-21 MED ORDER — OSELTAMIVIR PHOSPHATE 75 MG PO CAPS: 75.0000 mg | ORAL_CAPSULE | Freq: Two times a day (BID) | ORAL | 0 refills | Status: DC

## 2018-09-21 MED ORDER — BENZONATATE 100 MG PO CAPS: 100.0000 mg | ORAL_CAPSULE | Freq: Three times a day (TID) | ORAL | 0 refills | Status: DC | PRN

## 2018-09-21 NOTE — Progress Notes (Signed)
Acute Office Visit  Subjective:    Patient ID: Courtney Key, female    DOB: 1976/04/16, 43 y.o.   MRN: [id number]  Chief Complaint  Patient presents with  . URI    Patient is in today for sudden onset of fever, chills, aches,headache, rhinorrhea and cough starting last night. Has known exposure to the flu. Denies chest pain or SOB. Took 4 Ibuprofen a couple of hours ago with resolution of her higher fever. Has not taken anything else for symptoms.  Past Medical History:  Diagnosis Date  . Chicken pox     Past Surgical History:  Procedure Laterality Date  . AUGMENTATION MAMMAPLASTY Bilateral   . BREAST LUMPECTOMY WITH RADIOACTIVE SEED LOCALIZATION Left 07/16/2018   Procedure: LEFT BREAST LUMPECTOMY WITH RADIOACTIVE SEED LOCALIZATION;  Surgeon: Claud Kelp, MD;  Location: [facility] OR;  Service: General;  Laterality: Left;  . BREAST SURGERY  2011   Augmentation  . CESAREAN SECTION  2007  . DEBRIDEMENT AND CLOSURE WOUND  2006  . DILATION AND CURETTAGE OF UTERUS  07/2005  . TUBAL LIGATION  2007    Family History  Problem Relation Age of Onset  . Heart attack Father   . Hyperlipidemia Sister   . Hypertension Sister   . Cancer Maternal Grandmother   . Cancer Maternal Grandfather   . Hearing loss Paternal Grandmother   . Heart attack Paternal Grandmother   . Hearing loss Paternal Grandfather   . Heart attack Paternal Grandfather   . Breast cancer Neg Hx     Social History   Socioeconomic History  . Marital status: Married    Spouse name: Not on file  . Number of children: Not on file  . Years of education: Not on file  . Highest education level: Not on file  Occupational History  . Not on file  Social Needs  . Financial resource strain: Not on file  . Food insecurity:    Worry: Not on file    Inability: Not on file  . Transportation needs:    Medical: Not on file    Non-medical: Not on file  Tobacco Use  . Smoking status: Never Smoker  . Smokeless tobacco:  Never Used  Substance and Sexual Activity  . Alcohol use: Never    Frequency: Never  . Drug use: Never  . Sexual activity: Yes  Lifestyle  . Physical activity:    Days per week: Not on file    Minutes per session: Not on file  . Stress: Not on file  Relationships  . Social connections:    Talks on phone: Not on file    Gets together: Not on file    Attends religious service: Not on file    Active member of club or organization: Not on file    Attends meetings of clubs or organizations: Not on file    Relationship status: Not on file  . Intimate partner violence:    Fear of current or ex partner: Not on file    Emotionally abused: Not on file    Physically abused: Not on file    Forced sexual activity: Not on file  Other Topics Concern  . Not on file  Social History Narrative  . Not on file    Outpatient Medications Prior to Visit  Medication Sig Dispense Refill  . HYDROcodone-acetaminophen (NORCO) 5-325 MG tablet Take 1-2 tablets by mouth every 6 (six) hours as needed. 20 tablet 0   No facility-administered medications prior to  visit.     Allergies  Allergen Reactions  . Penicillins Hives and Other (See Comments)    Has patient had a PCN reaction causing immediate rash, facial/tongue/throat swelling, SOB or lightheadedness with hypotension: Yes Has patient had a PCN reaction causing severe rash involving mucus membranes or skin necrosis: No Has patient had a PCN reaction that required hospitalization: No Has patient had a PCN reaction occurring within the last 10 years: No If all of the above answers are "NO", then may proceed with Cephalosporin use.   . Sulfa Antibiotics Hives    Review of Systems  Constitutional: Positive for chills and malaise/fatigue.  HENT: Positive for congestion, rhinorrhea, sinus pain and sore throat.   Eyes: Negative.   Respiratory: Positive for cough. Negative for wheezing.   Cardiovascular: Positive for chest pain.  Gastrointestinal:  Negative for abdominal pain, diarrhea, nausea and vomiting.  Musculoskeletal: Negative for joint pain and neck pain.  Neurological: Positive for weakness and headaches (sinus areas).    Objective:    Physical Exam  Constitutional: She appears well-developed and well-nourished.  HENT:  Head: Normocephalic.  Right Ear: Tympanic membrane, external ear and ear canal normal.  Left Ear: Tympanic membrane, external ear and ear canal normal.  Mouth/Throat: Uvula is midline, oropharynx is clear and moist and mucous membranes are normal.  Eyes: Pupils are equal, round, and reactive to light.  Neck: Normal range of motion.  Cardiovascular: Tachycardia present.  Pulmonary/Chest: Effort normal and breath sounds normal.   BP 90/60   Pulse (!) 122   Temp 98.8 F (37.1 C) (Oral)   Resp 16   Ht 5\' 2"  (1.575 m)   Wt 44 kg   SpO2 99%   BMI 17.74 kg/m  Wt Readings from Last 3 Encounters:  09/21/18 44 kg  07/10/18 42.6 kg  04/24/18 41.7 kg    Health Maintenance Due  Topic Date Due  . INFLUENZA VACCINE  04/12/2018    There are no preventive care reminders to display for this patient.   No results found for: TSH Lab Results  Component Value Date   WBC 7.6 07/10/2018   HGB 12.5 07/10/2018   HCT 39.3 07/10/2018   MCV 87.9 07/10/2018   PLT 270 07/10/2018   Lab Results  Component Value Date   NA 139 07/10/2018   K 3.6 07/10/2018   CO2 26 07/10/2018   GLUCOSE 97 07/10/2018   BUN 9 07/10/2018   CREATININE 0.60 07/10/2018   BILITOT 0.4 07/10/2018   ALKPHOS 41 07/10/2018   AST 19 07/10/2018   ALT 13 07/10/2018   PROT 6.8 07/10/2018   ALBUMIN 4.3 07/10/2018   CALCIUM 9.5 07/10/2018   ANIONGAP 8 07/10/2018   GFR 92.78 04/24/2018   Lab Results  Component Value Date   CHOL 157 04/24/2018   Lab Results  Component Value Date   HDL 56.00 04/24/2018   Lab Results  Component Value Date   LDLCALC 90 04/24/2018   Lab Results  Component Value Date   TRIG 56.0 04/24/2018    Lab Results  Component Value Date   CHOLHDL 3 04/24/2018   No results found for: HGBA1C     Assessment & Plan:    1. Body aches - POC Influenza A&B(BINAX/QUICKVUE)  2. Flu-like symptoms Negative flu swab but symptoms fit flu-like illness. < 24 hour of symptoms. Will start Tamilfu 75 mg BID. Start Tessalon. Supportive measures and OTC medications reviewed. She is to focus on hydration.   - oseltamivir (TAMIFLU) 75  MG capsule; Take 1 capsule (75 mg total) by mouth 2 (two) times daily.  Dispense: 10 capsule; Refill: 0 - benzonatate (TESSALON) 100 MG capsule; Take 1 capsule (100 mg total) by mouth 3 (three) times daily as needed for cough.  Dispense: 30 capsule; Refill: 0

## 2018-09-21 NOTE — Patient Instructions (Signed)
Please keep well-hydrated and get plenty of rest.  Start the Tamiflu as directed twice daily.  Alternate tylenol and ibuprofen for fever and aches.  Saline nasal rinse daily and placing a humidifier in the bedroom will also be beneficial. Take the cough medication as directed.

## 2018-09-27 ENCOUNTER — Encounter: Payer: Self-pay | Admitting: Physician Assistant

## 2019-07-30 ENCOUNTER — Ambulatory Visit (INDEPENDENT_AMBULATORY_CARE_PROVIDER_SITE_OTHER): Payer: Federal, State, Local not specified - PPO | Admitting: Physician Assistant

## 2019-07-30 ENCOUNTER — Encounter: Payer: Self-pay | Admitting: Physician Assistant

## 2019-07-30 ENCOUNTER — Other Ambulatory Visit: Payer: Self-pay

## 2019-07-30 VITALS — BP 104/68 | HR 72 | Temp 98.6°F | Resp 14 | Ht 62.0 in | Wt 97.0 lb

## 2019-07-30 DIAGNOSIS — Z1231 Encounter for screening mammogram for malignant neoplasm of breast: Secondary | ICD-10-CM | POA: Diagnosis not present

## 2019-07-30 DIAGNOSIS — Z0000 Encounter for general adult medical examination without abnormal findings: Secondary | ICD-10-CM | POA: Diagnosis not present

## 2019-07-30 DIAGNOSIS — G4700 Insomnia, unspecified: Secondary | ICD-10-CM

## 2019-07-30 NOTE — Patient Instructions (Signed)
Please go to the lab for blood work.   Our office will call you with your results unless you have chosen to receive results via MyChart.  If your blood work is normal we will follow-up each year for physicals and as scheduled for chronic medical problems.  If anything is abnormal we will treat accordingly and get you in for a follow-up.  Sleep Hygiene  Do: (1) Go to bed at the same time each day. (2) Get up from bed at the same time each day. (3) Get regular exercise each day, preferably in the morning.  There is goof evidence that regular exercise improves restful sleep.  This includes stretching and aerobic exercise. (4) Get regular exposure to outdoor or bright lights, especially in the late afternoon. (5) Keep the temperature in your bedroom comfortable. (6) Keep the bedroom quiet when sleeping. (7) Keep the bedroom dark enough to facilitate sleep. (8) Use your bed only for sleep and sex. (9) Take medications as directed.  It is helpful to take prescribed sleeping pills 1 hour before bedtime, so they are causing drowsiness when you lie down, or 10 hours before getting up, to avoid daytime drowsiness. (10) Use a relaxation exercise just before going to sleep -- imagery, massage, warm bath. (11) Keep your feet and hands warm.  Wear warm socks and/or mittens or gloves to bed.  Don't: (1) Exercise just before going to bed. (2) Engage in stimulating activity just before bed, such as playing a competitive game, watching an exciting program on television, or having an important discussion with a loved one. (3) Have caffeine in the evening (coffee, teas, chocolate, sodas, etc.) (4) Read or watch television in bed. (5) Use alcohol to help you sleep. (6) Go to bed too hungry or too full. (7) Take another person's sleeping pills. (8) Take over-the-counter sleeping pills, without your doctor's knowledge.  Tolerance can develop rapidly with these medications.  Diphenhydramine can have serious  side effects for elderly patients. (9) Take daytime naps. (10) Command yourself to go to sleep.  This only makes your mind and body more alert.  If you lie awake for more than 20-30 minutes, get up, go to a different room, participate in a quiet activity (Ex - non-excitable reading or television), and then return to bed when you feel sleepy.  Do this as many times during the night as needed.  This may cause you to have a night or two of poor sleep but it will train your brain to know when it is time for sleep.

## 2019-07-30 NOTE — Progress Notes (Signed)
Patient presents to clinic today for annual exam.  Patient is fasting for labs.  Acute Concerns: Insomnia-- has developed over the past few weeks, wakes up during the night with racing thoughts, difficulty falling back to sleep. Has had increased anxiety lately due to work schedule and son's struggle with drug addiction  Chronic Issues: No chronic issues   Health Maintenance: Immunizations -- UTD Mammogram -- will schedule at breast center  PAP -- UTD  Past Medical History:  Diagnosis Date  . Chicken pox     Past Surgical History:  Procedure Laterality Date  . AUGMENTATION MAMMAPLASTY Bilateral   . BREAST LUMPECTOMY WITH RADIOACTIVE SEED LOCALIZATION Left 07/16/2018   Procedure: LEFT BREAST LUMPECTOMY WITH RADIOACTIVE SEED LOCALIZATION;  Surgeon: Fanny Skates, MD;  Location: [facility];  Service: General;  Laterality: Left;  . BREAST SURGERY  2011   Augmentation  . CESAREAN SECTION  2007  . DEBRIDEMENT AND CLOSURE WOUND  2006  . DILATION AND CURETTAGE OF UTERUS  07/2005  . TUBAL LIGATION  2007    No current outpatient medications on file prior to visit.   No current facility-administered medications on file prior to visit.     Allergies  Allergen Reactions  . Penicillins Hives and Other (See Comments)    Has patient had a PCN reaction causing immediate rash, facial/tongue/throat swelling, SOB or lightheadedness with hypotension: Yes Has patient had a PCN reaction causing severe rash involving mucus membranes or skin necrosis: No Has patient had a PCN reaction that required hospitalization: No Has patient had a PCN reaction occurring within the last 10 years: No If all of the above answers are "NO", then may proceed with Cephalosporin use.   . Sulfa Antibiotics Hives    Family History  Problem Relation Age of Onset  . Heart attack Father   . Hyperlipidemia Sister   . Hypertension Sister   . Cancer Maternal Grandmother   . Cancer Maternal Grandfather   . Hearing  loss Paternal Grandmother   . Heart attack Paternal Grandmother   . Hearing loss Paternal Grandfather   . Heart attack Paternal Grandfather   . Breast cancer Neg Hx     Social History   Socioeconomic History  . Marital status: Married    Spouse name: Not on file  . Number of children: Not on file  . Years of education: Not on file  . Highest education level: Not on file  Occupational History  . Not on file  Social Needs  . Financial resource strain: Not on file  . Food insecurity    Worry: Not on file    Inability: Not on file  . Transportation needs    Medical: Not on file    Non-medical: Not on file  Tobacco Use  . Smoking status: Never Smoker  . Smokeless tobacco: Never Used  Substance and Sexual Activity  . Alcohol use: Never    Frequency: Never  . Drug use: Never  . Sexual activity: Yes  Lifestyle  . Physical activity    Days per week: Not on file    Minutes per session: Not on file  . Stress: Not on file  Relationships  . Social Herbalist on phone: Not on file    Gets together: Not on file    Attends religious service: Not on file    Active member of club or organization: Not on file    Attends meetings of clubs or organizations: Not on file  Relationship status: Not on file  . Intimate partner violence    Fear of current or ex partner: Not on file    Emotionally abused: Not on file    Physically abused: Not on file    Forced sexual activity: Not on file  Other Topics Concern  . Not on file  Social History Narrative  . Not on file   Review of Systems  Constitutional: Negative for chills, fever and weight loss.  HENT: Negative for hearing loss and tinnitus.   Eyes: Negative for blurred vision and double vision.  Respiratory: Negative for cough and shortness of breath.   Cardiovascular: Negative for chest pain (chest tightness) and palpitations.  Gastrointestinal: Negative for abdominal pain, blood in stool, constipation, diarrhea,  heartburn, melena, nausea and vomiting.  Genitourinary: Negative for dysuria, frequency and urgency.  Neurological: Positive for headaches. Negative for dizziness and loss of consciousness.  Psychiatric/Behavioral: Negative for depression. The patient is nervous/anxious and has insomnia.    Resp 14   Ht '5\' 2"'  (1.575 m)   Wt 97 lb (44 kg)   BMI 17.74 kg/m   Physical Exam Vitals signs reviewed.  HENT:     Head: Normocephalic and atraumatic.     Right Ear: Tympanic membrane, ear canal and external ear normal.     Left Ear: Tympanic membrane, ear canal and external ear normal.     Nose: Nose normal. No mucosal edema.     Mouth/Throat:     Pharynx: Uvula midline. No oropharyngeal exudate or posterior oropharyngeal erythema.  Eyes:     Conjunctiva/sclera: Conjunctivae normal.     Pupils: Pupils are equal, round, and reactive to light.  Neck:     Musculoskeletal: Neck supple.     Thyroid: No thyromegaly.  Cardiovascular:     Rate and Rhythm: Normal rate and regular rhythm.     Heart sounds: Normal heart sounds.  Pulmonary:     Effort: Pulmonary effort is normal. No respiratory distress.     Breath sounds: Normal breath sounds. No wheezing or rales.  Abdominal:     General: Bowel sounds are normal. There is no distension.     Palpations: Abdomen is soft. There is no mass.     Tenderness: There is no abdominal tenderness. There is no guarding or rebound.  Lymphadenopathy:     Cervical: No cervical adenopathy.  Skin:    General: Skin is warm and dry.     Findings: No rash.  Neurological:     Mental Status: She is alert and oriented to person, place, and time.     Cranial Nerves: No cranial nerve deficit.    Assessment/Plan: 1. Visit for preventive health examination Depression screen negative. Health Maintenance reviewed -- Pap UTD, Immunizations UTD, Mammogram Due (Average risk, asymptomatic). Preventive schedule discussed and handout given in AVS. Will obtain fasting labs  today.  - CBC w/Diff - Comp Met (CMET) - Lipid Profile - Hemoglobin A1c - TSH  2. Encounter for screening mammogram for malignant neoplasm of breast Order for repeat screening mammogram placed.   3. Insomnia, unspecified type Secondary to stressors. Discussed sleep hygiene and stress relief tactics. Can start OTC melatonin. Avoiding other OTC or Rx sleep aids due to her work schedule and need to get up early. Follow-up if not improving.    Leeanne Rio, PA-C

## 2019-07-31 LAB — LIPID PANEL
Cholesterol: 151 mg/dL (ref 0–200)
HDL: 52.4 mg/dL (ref >39.00)
LDL Cholesterol: 90 mg/dL (ref 0–99)
NonHDL: 98.62
Total CHOL/HDL Ratio: 3
Triglycerides: 45 mg/dL (ref 0.0–149.0)
VLDL: 9 mg/dL (ref 0.0–40.0)

## 2019-07-31 LAB — COMPREHENSIVE METABOLIC PANEL
ALT: 8 U/L (ref 0–35)
AST: 14 U/L (ref 0–37)
Albumin: 4.8 g/dL (ref 3.5–5.2)
Alkaline Phosphatase: 36 U/L — ABNORMAL LOW (ref 39–117)
BUN: 9 mg/dL (ref 6–23)
CO2: 28 mEq/L (ref 19–32)
Calcium: 9.7 mg/dL (ref 8.4–10.5)
Chloride: 102 mEq/L (ref 96–112)
Creatinine, Ser: 0.61 mg/dL (ref 0.40–1.20)
GFR: 106.75 mL/min (ref >60.00)
Glucose, Bld: 109 mg/dL — ABNORMAL HIGH (ref 70–99)
Potassium: 3.7 mEq/L (ref 3.5–5.1)
Sodium: 139 mEq/L (ref 135–145)
Total Bilirubin: 0.5 mg/dL (ref 0.2–1.2)
Total Protein: 6.8 g/dL (ref 6.0–8.3)

## 2019-07-31 LAB — HEMOGLOBIN A1C: Hgb A1c MFr Bld: 5.4 % (ref 4.6–6.5)

## 2019-07-31 LAB — CBC WITH DIFFERENTIAL/PLATELET
Basophils Absolute: 0.1 10*3/uL (ref 0.0–0.1)
Basophils Relative: 1 % (ref 0.0–3.0)
Eosinophils Absolute: 0.1 10*3/uL (ref 0.0–0.7)
Eosinophils Relative: 1 % (ref 0.0–5.0)
HCT: 37 % (ref 36.0–46.0)
Hemoglobin: 12.5 g/dL (ref 12.0–15.0)
Lymphocytes Relative: 22.3 % (ref 12.0–46.0)
Lymphs Abs: 1.7 10*3/uL (ref 0.7–4.0)
MCHC: 33.7 g/dL (ref 30.0–36.0)
MCV: 84.9 fl (ref 78.0–100.0)
Monocytes Absolute: 0.4 10*3/uL (ref 0.1–1.0)
Monocytes Relative: 5.7 % (ref 3.0–12.0)
Neutro Abs: 5.3 10*3/uL (ref 1.4–7.7)
Neutrophils Relative %: 70 % (ref 43.0–77.0)
Platelets: 254 10*3/uL (ref 150.0–400.0)
RBC: 4.36 Mil/uL (ref 3.87–5.11)
RDW: 13.5 % (ref 11.5–15.5)
WBC: 7.6 10*3/uL (ref 4.0–10.5)

## 2019-07-31 LAB — TSH: TSH: 4.27 u[IU]/mL (ref 0.35–4.50)

## 2019-11-27 IMAGING — MG DIGITAL SCREENING BILATERAL MAMMOGRAM WITH IMPLANTS, CAD AND TOM
9 of 12 series · 9 of 28 positions shown · non-contrast
Comparison: None.

CLINICAL DATA: Screening. This is the patient's initial baseline
screening mammogram.

EXAM:
DIGITAL SCREENING BILATERAL MAMMOGRAM WITH IMPLANTS, CAD AND TOMO

[R CC]
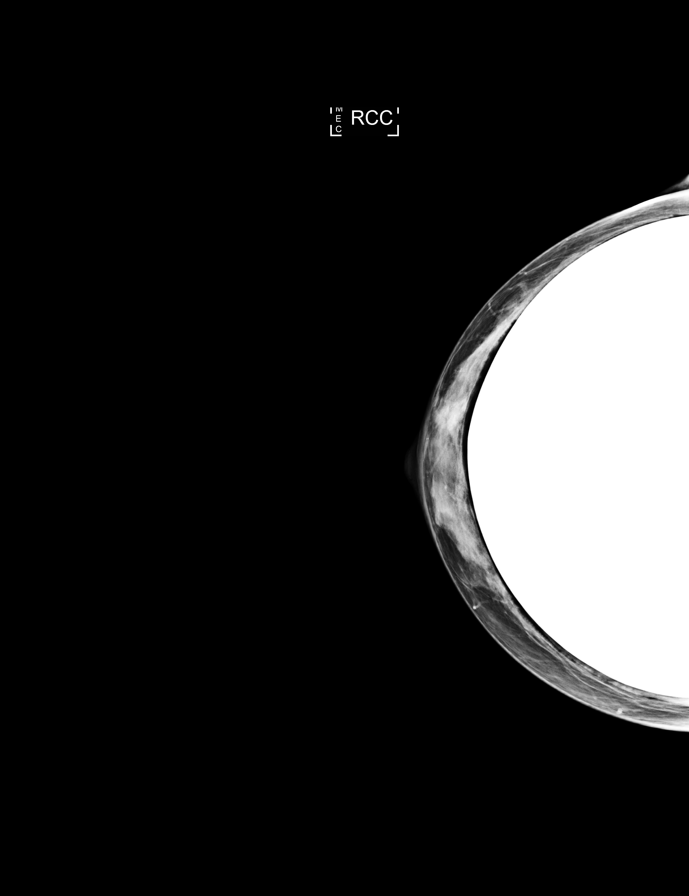

[R MLO]
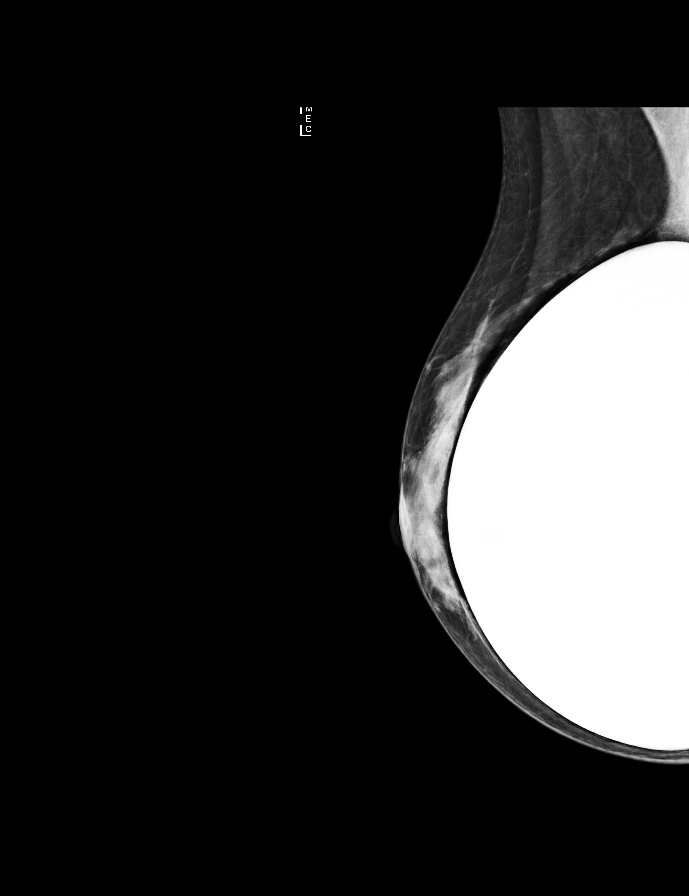

[L MLO]
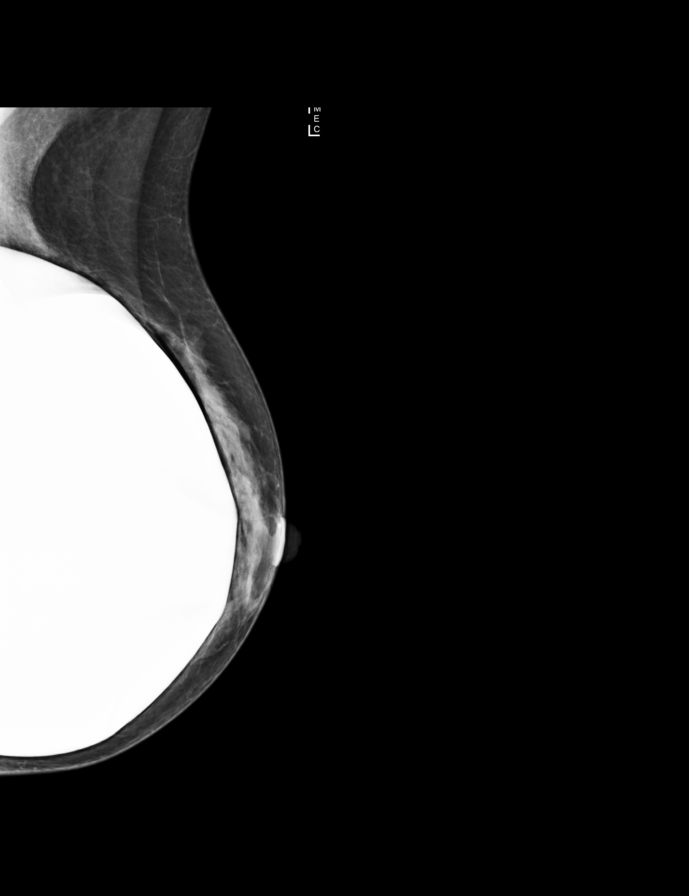

[L CC]
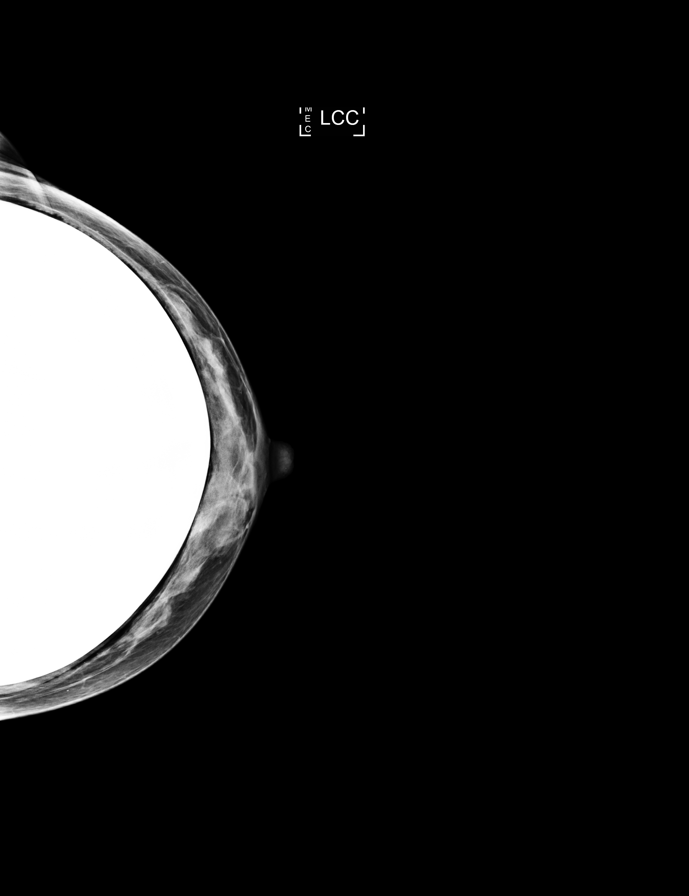

[L MLO synth-2D]
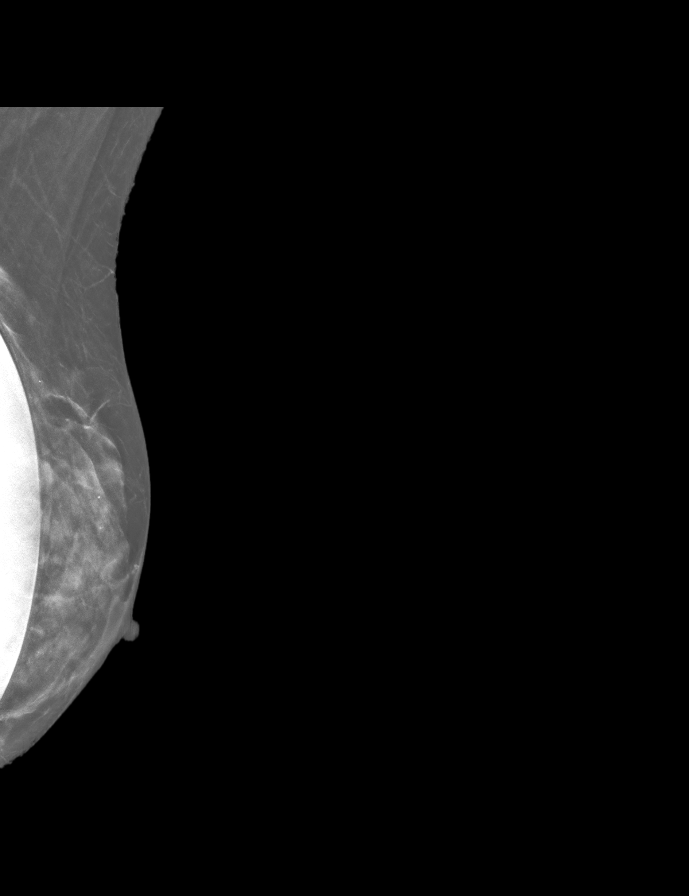

[R CC synth-2D]
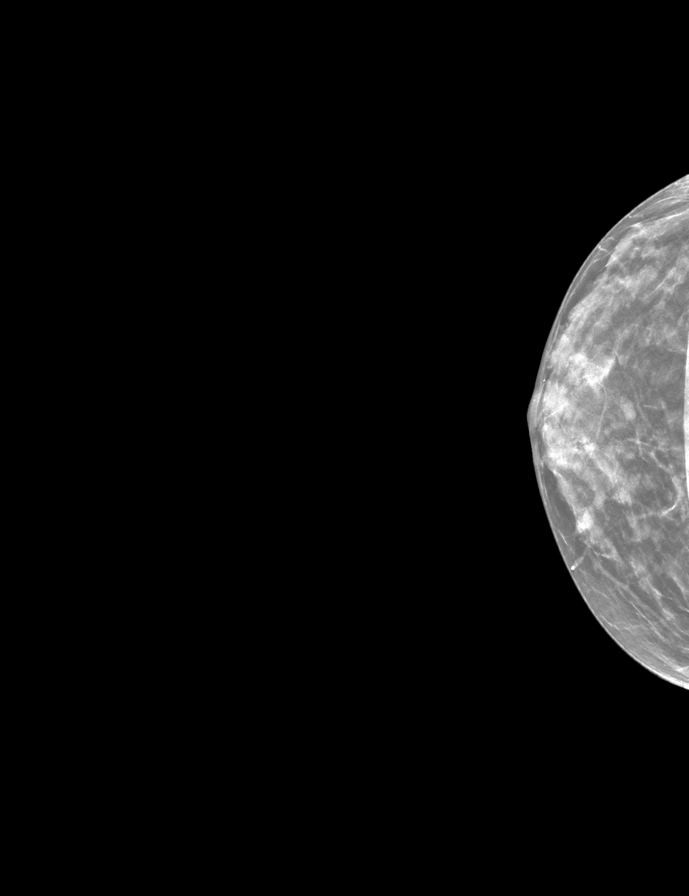

[R MLO synth-2D]
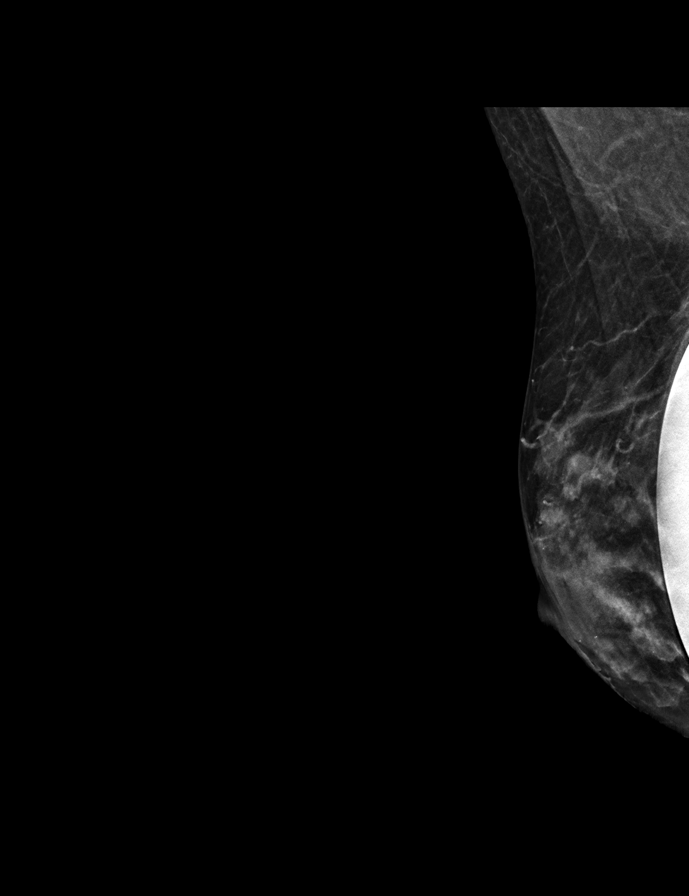

[L CC synth-2D]
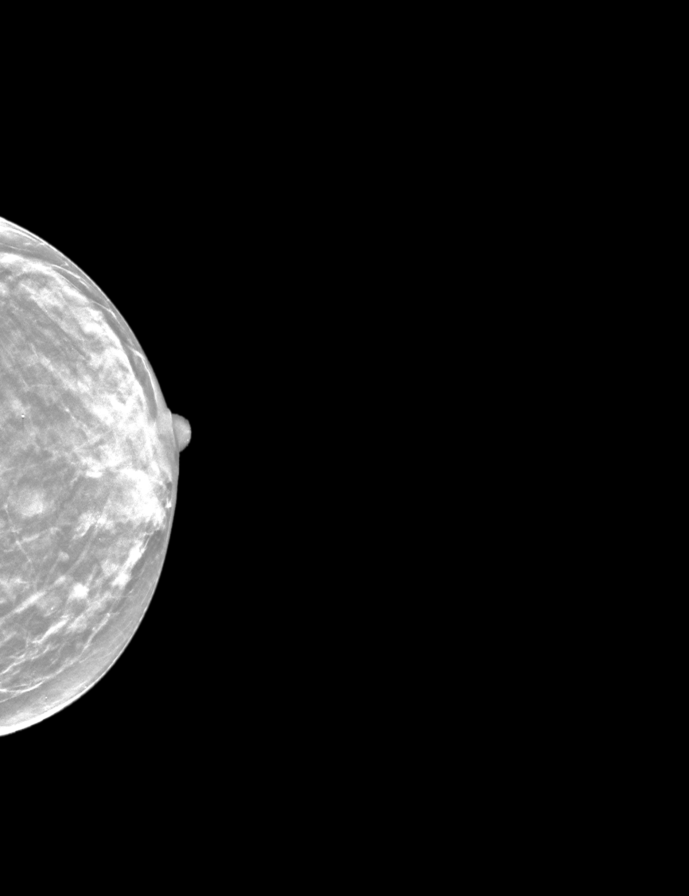

[L CCID BREAST TOMOSYNTHESIS IMAGE tomo · tomo slice 16/31.0]
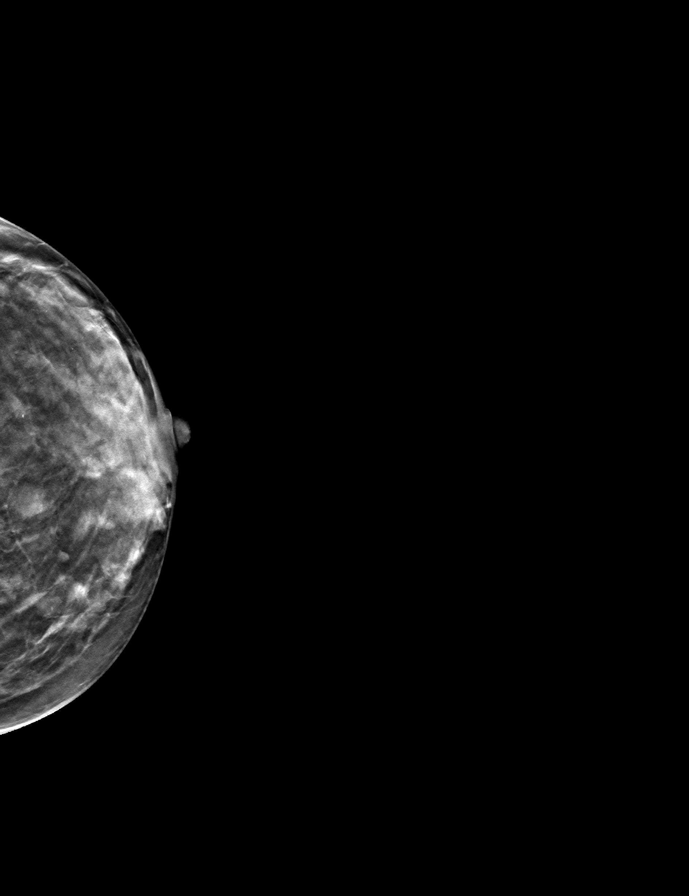

[9 of 28 positions shown; findings below may reference images not displayed]

ACR Breast Density Category c: The breast tissue is heterogeneously
dense, which may obscure small masses.
FINDINGS: The patient has retropectoral saline implants. Standard 2D full
field CC and MLO views of both breasts and tomosynthesis and
synthesized implant displaced CC and MLO views of both breasts were
obtained.

In the left breast, calcifications warrant further evaluation. In
the right breast, no findings suspicious for malignancy. Images were
processed with CAD.
IMPRESSION: Further evaluation is suggested for calcifications in the left
breast.

RECOMMENDATION:
Diagnostic mammogram of the left breast. (Code:8J-2-FFS)

The patient will be contacted regarding the findings, and additional
imaging will be scheduled.

BI-RADS CATEGORY  0: Incomplete. Need additional imaging evaluation
and/or prior mammograms for comparison.

## 2021-02-11 ENCOUNTER — Encounter: Payer: Federal, State, Local not specified - PPO | Admitting: Registered Nurse

## 2021-02-19 DIAGNOSIS — Z20822 Contact with and (suspected) exposure to covid-19: Secondary | ICD-10-CM | POA: Diagnosis not present

## 2021-03-04 DIAGNOSIS — Z1329 Encounter for screening for other suspected endocrine disorder: Secondary | ICD-10-CM | POA: Diagnosis not present

## 2021-03-04 DIAGNOSIS — Z1159 Encounter for screening for other viral diseases: Secondary | ICD-10-CM | POA: Diagnosis not present

## 2021-03-04 DIAGNOSIS — Z681 Body mass index (BMI) 19 or less, adult: Secondary | ICD-10-CM | POA: Diagnosis not present

## 2021-03-04 DIAGNOSIS — Z13220 Encounter for screening for lipoid disorders: Secondary | ICD-10-CM | POA: Diagnosis not present

## 2021-03-04 DIAGNOSIS — Z131 Encounter for screening for diabetes mellitus: Secondary | ICD-10-CM | POA: Diagnosis not present

## 2021-03-04 DIAGNOSIS — Z1321 Encounter for screening for nutritional disorder: Secondary | ICD-10-CM | POA: Diagnosis not present

## 2021-03-31 DIAGNOSIS — M792 Neuralgia and neuritis, unspecified: Secondary | ICD-10-CM | POA: Diagnosis not present

## 2021-03-31 DIAGNOSIS — Z1283 Encounter for screening for malignant neoplasm of skin: Secondary | ICD-10-CM | POA: Diagnosis not present

## 2021-03-31 DIAGNOSIS — D485 Neoplasm of uncertain behavior of skin: Secondary | ICD-10-CM | POA: Diagnosis not present

## 2021-03-31 DIAGNOSIS — D225 Melanocytic nevi of trunk: Secondary | ICD-10-CM | POA: Diagnosis not present

## 2021-04-26 DIAGNOSIS — D485 Neoplasm of uncertain behavior of skin: Secondary | ICD-10-CM | POA: Diagnosis not present

## 2021-04-26 DIAGNOSIS — D225 Melanocytic nevi of trunk: Secondary | ICD-10-CM | POA: Diagnosis not present

## 2021-04-26 DIAGNOSIS — L905 Scar conditions and fibrosis of skin: Secondary | ICD-10-CM | POA: Diagnosis not present

## 2021-06-01 DIAGNOSIS — D485 Neoplasm of uncertain behavior of skin: Secondary | ICD-10-CM | POA: Diagnosis not present

## 2021-06-01 DIAGNOSIS — D225 Melanocytic nevi of trunk: Secondary | ICD-10-CM | POA: Diagnosis not present

## 2021-06-01 DIAGNOSIS — D2272 Melanocytic nevi of left lower limb, including hip: Secondary | ICD-10-CM | POA: Diagnosis not present

## 2021-07-21 DIAGNOSIS — R921 Mammographic calcification found on diagnostic imaging of breast: Secondary | ICD-10-CM | POA: Diagnosis not present

## 2021-07-21 DIAGNOSIS — R922 Inconclusive mammogram: Secondary | ICD-10-CM | POA: Diagnosis not present

## 2021-07-21 DIAGNOSIS — R928 Other abnormal and inconclusive findings on diagnostic imaging of breast: Secondary | ICD-10-CM | POA: Diagnosis not present

## 2022-07-28 ENCOUNTER — Ambulatory Visit: Payer: Federal, State, Local not specified - PPO | Attending: Cardiology | Admitting: Cardiology

## 2022-07-28 ENCOUNTER — Encounter: Payer: Self-pay | Admitting: Cardiology

## 2022-07-28 VITALS — BP 98/62 | HR 66 | Ht 62.0 in | Wt 102.6 lb

## 2022-07-28 DIAGNOSIS — Z01812 Encounter for preprocedural laboratory examination: Secondary | ICD-10-CM

## 2022-07-28 DIAGNOSIS — R072 Precordial pain: Secondary | ICD-10-CM | POA: Diagnosis not present

## 2022-07-28 MED ORDER — IVABRADINE HCL 5 MG PO TABS: ORAL_TABLET | ORAL | 0 refills | Status: AC

## 2022-07-28 NOTE — Patient Instructions (Addendum)
Medication Instructions:  Your physician recommends that you continue on your current medications as directed. Please refer to the Current Medication list given to you today.  *If you need a refill on your cardiac medications before your next appointment, please call your pharmacy*   Lab Work: Your physician recommends that you have the following lab drawn today BMET  If you have labs (blood work) drawn today and your tests are completely normal, you will receive your results only by: MyChart Message (if you have MyChart) OR A paper copy in the mail If you have any lab test that is abnormal or we need to change your treatment, we will call you to review the results.   Testing/Procedures: Cardiac CT Angiography (CTA), is a special type of CT scan that uses a computer to produce multi-dimensional views of major blood vessels throughout the body. In CT angiography, a contrast material is injected through an IV to help visualize the blood vessels    Follow-Up: At [facility], you and your health needs are our priority.  As part of our continuing mission to provide you with exceptional heart care, we have created designated Provider Care Teams.  These Care Teams include your primary Cardiologist (physician) and Advanced Practice Providers (APPs -  Physician Assistants and Nurse Practitioners) who all work together to provide you with the care you need, when you need it.  We recommend signing up for the patient portal called "MyChart".  Sign up information is provided on this After Visit Summary.  MyChart is used to connect with patients for Virtual Visits (Telemedicine).  Patients are able to view lab/test results, encounter notes, upcoming appointments, etc.  Non-urgent messages can be sent to your provider as well.   To learn more about what you can do with MyChart, go to [email or web address].    Your next appointment:   As Needed   The format for your next appointment:   In  Person  Provider:   Thomasene Ripple, DO     Other Instructions   Your cardiac CT will be scheduled at one of the below locations:   [facility] [address] [place], [place] [address] [phone]  If scheduled at [facility], please arrive at the [facility] and Children's Entrance (Entrance C2) of [facility] 30 minutes prior to test start time. You can use the FREE valet parking offered at entrance C (encouraged to control the heart rate for the test)  Proceed to the [facility] Radiology Department (first floor) to check-in and test prep.  All radiology patients and guests should use entrance C2 at [facility], accessed from [place], even though the hospital's physical address listed is [address]    On the Night Before the Test: Be sure to Drink plenty of water. Do not consume any caffeinated/decaffeinated beverages or chocolate 12 hours prior to your test. Do not take any antihistamines 12 hours prior to your test.  On the Day of the Test: Drink plenty of water until 1 hour prior to the test. Do not eat any food 1 hour prior to test. You may take your regular medications prior to the test.  Take Ivabradine 10mg  two hours prior to test. FEMALES- please wear underwire-free bra if available, avoid dresses & tight clothing       After the Test: Drink plenty of water. After receiving IV contrast, you may experience a mild flushed feeling. This is normal. On occasion, you  may experience a mild rash up to 24 hours after the test. This is not dangerous. If this occurs, you can take Benadryl 25 mg and increase your fluid intake. If you experience trouble breathing, this can be serious. If it is severe call 911 IMMEDIATELY. If it is mild, please call our office.  We will call to schedule your test 2-4 weeks out understanding that some insurance companies will need an authorization prior to the service being performed.    For non-scheduling related questions, please contact the cardiac imaging nurse navigator should you have any questions/concerns: Rockwell Alexandria, Cardiac Imaging Nurse Navigator Larey Brick, Cardiac Imaging Nurse Navigator [facility] Direct Office Dial: [phone]   For scheduling needs, including cancellations and rescheduling, please call [place], [phone].

## 2022-07-28 NOTE — Progress Notes (Signed)
Cardiology Office Note:    Date:  07/28/2022   ID:  Courtney Key, DOB [birth year], MRN [id number]  PCP:  Waldon Merl, PA-C  Cardiologist:  Thomasene Ripple, DO  Electrophysiologist:  None   Referring MD: Wille Glaser, PA-C    History of Present Illness:    Courtney Key is a 46 y.o. female with a family history of premature coronary artery disease in her father at the age of 59 he just an MI leading to a stent.  She tells me recently that she has had some subtle chest discomfort which has been going on for a year.  She describes this as a dull problem set of chest tightness on the left side.  It comes and goes.  At times when she experiences she feels short of breath.  Sometimes when she feels it and get short of breath when she coughs she thinks that it improves.  But she has not really discussed this with anyone.   Past Medical History:  Diagnosis Date   Chicken pox     Past Surgical History:  Procedure Laterality Date   AUGMENTATION MAMMAPLASTY Bilateral    BREAST LUMPECTOMY WITH RADIOACTIVE SEED LOCALIZATION Left 07/16/2018   Procedure: LEFT BREAST LUMPECTOMY WITH RADIOACTIVE SEED LOCALIZATION;  Surgeon: Claud Kelp, MD;  Location: [facility] OR;  Service: General;  Laterality: Left;   BREAST SURGERY  2011   Augmentation   CESAREAN SECTION  2007   DEBRIDEMENT AND CLOSURE WOUND  2006   DILATION AND CURETTAGE OF UTERUS  07/2005   TUBAL LIGATION  2007    Current Medications: Current Meds  Medication Sig   ivabradine (CORLANOR) 5 MG TABS tablet Take 2 hours before CT scan   SUMAtriptan (IMITREX) 50 MG tablet Take 50 mg by mouth daily.     Allergies:   Penicillins and Sulfa antibiotics   Social History   Socioeconomic History   Marital status: Married    Spouse name: Not on file   Number of children: 2   Years of education: Not on file   Highest education level: Not on file  Occupational History   Not on file  Tobacco Use   Smoking status: Never    Smokeless tobacco: Never  Vaping Use   Vaping Use: Never used  Substance and Sexual Activity   Alcohol use: Never   Drug use: Never   Sexual activity: Yes  Other Topics Concern   Not on file  Social History Narrative   Not on file   Social Determinants of Health   Financial Resource Strain: Not on file  Food Insecurity: Not on file  Transportation Needs: Not on file  Physical Activity: Not on file  Stress: Not on file  Social Connections: Not on file     Family History: The patient's family history includes Cancer in her maternal grandfather and maternal grandmother; Hearing loss in her paternal grandfather and paternal grandmother; Heart attack in her father, paternal grandfather, and paternal grandmother; Hyperlipidemia in her sister; Hypertension in her sister. There is no history of Breast cancer.  ROS:   Review of Systems  Constitution: Negative for decreased appetite, fever and weight gain.  HENT: Negative for congestion, ear discharge, hoarse voice and sore throat.   Eyes: Negative for discharge, redness, vision loss in right eye and visual halos.  Cardiovascular: Negative for chest pain, dyspnea on exertion, leg swelling, orthopnea and palpitations.  Respiratory: Negative for cough, hemoptysis, shortness of breath and snoring.   Endocrine: Negative  for heat intolerance and polyphagia.  Hematologic/Lymphatic: Negative for bleeding problem. Does not bruise/bleed easily.  Skin: Negative for flushing, nail changes, rash and suspicious lesions.  Musculoskeletal: Negative for arthritis, joint pain, muscle cramps, myalgias, neck pain and stiffness.  Gastrointestinal: Negative for abdominal pain, bowel incontinence, diarrhea and excessive appetite.  Genitourinary: Negative for decreased libido, genital sores and incomplete emptying.  Neurological: Negative for brief paralysis, focal weakness, headaches and loss of balance.  Psychiatric/Behavioral: Negative for altered mental  status, depression and suicidal ideas.  Allergic/Immunologic: Negative for HIV exposure and persistent infections.    EKGs/Labs/Other Studies Reviewed:    The following studies were reviewed today:   EKG:  The ekg ordered today demonstrates sinus rhythm, heart rate 60 bpm.  Recent Labs: No results found for requested labs within last 365 days.  Recent Lipid Panel    Component Value Date/Time   CHOL 151 07/30/2019 1451   TRIG 45.0 07/30/2019 1451   HDL 52.40 07/30/2019 1451   CHOLHDL 3 07/30/2019 1451   VLDL 9.0 07/30/2019 1451   LDLCALC 90 07/30/2019 1451    Physical Exam:    VS:  BP 98/62   Pulse 66   Ht 5\' 2"  (1.575 m)   Wt 102 lb 9.6 oz (46.5 kg)   SpO2 99%   BMI 18.77 kg/m     Wt Readings from Last 3 Encounters:  07/28/22 102 lb 9.6 oz (46.5 kg)  07/30/19 97 lb (44 kg)  09/21/18 97 lb (44 kg)     GEN: Well nourished, well developed in no acute distress HEENT: Normal NECK: No JVD; No carotid bruits LYMPHATICS: No lymphadenopathy CARDIAC: S1S2 noted,RRR, no murmurs, rubs, gallops RESPIRATORY:  Clear to auscultation without rales, wheezing or rhonchi  ABDOMEN: Soft, non-tender, non-distended, +bowel sounds, no guarding. EXTREMITIES: No edema, No cyanosis, no clubbing MUSCULOSKELETAL:  No deformity  SKIN: Warm and dry NEUROLOGIC:  Alert and oriented x 3, non-focal PSYCHIATRIC:  Normal affect, good insight  ASSESSMENT:    1. Precordial pain   2. Encounter for preprocedural laboratory examination    PLAN:     The symptoms chest pain is concerning, this patient does have intermediate risk for coronary artery disease and at this time I would like to pursue an ischemic evaluation in this patient.  Shared decision a coronary CTA at this time is appropriate.  I have discussed with the patient about the testing.  The patient has no IV contrast allergy and is agreeable to proceed with this test.  The patient is in agreement with the above plan. The patient left  the office in stable condition.  The patient will follow up in   Medication Adjustments/Labs and Tests Ordered: Current medicines are reviewed at length with the patient today.  Concerns regarding medicines are outlined above.  Orders Placed This Encounter  Procedures   CT CORONARY MORPH W/CTA COR W/SCORE W/CA W/CM &/OR WO/CM   Basic Metabolic Panel (BMET)   EKG 12-Lead   Meds ordered this encounter  Medications   ivabradine (CORLANOR) 5 MG TABS tablet    Sig: Take 2 hours before CT scan    Dispense:  2 tablet    Refill:  0    Patient Instructions  Medication Instructions:  Your physician recommends that you continue on your current medications as directed. Please refer to the Current Medication list given to you today.  *If you need a refill on your cardiac medications before your next appointment, please call your pharmacy*   Lab  Work: Your physician recommends that you have the following lab drawn today BMET  If you have labs (blood work) drawn today and your tests are completely normal, you will receive your results only by: MyChart Message (if you have MyChart) OR A paper copy in the mail If you have any lab test that is abnormal or we need to change your treatment, we will call you to review the results.   Testing/Procedures: Cardiac CT Angiography (CTA), is a special type of CT scan that uses a computer to produce multi-dimensional views of major blood vessels throughout the body. In CT angiography, a contrast material is injected through an IV to help visualize the blood vessels    Follow-Up: At [facility], you and your health needs are our priority.  As part of our continuing mission to provide you with exceptional heart care, we have created designated Provider Care Teams.  These Care Teams include your primary Cardiologist (physician) and Advanced Practice Providers (APPs -  Physician Assistants and Nurse Practitioners) who all work together to provide you  with the care you need, when you need it.  We recommend signing up for the patient portal called "MyChart".  Sign up information is provided on this After Visit Summary.  MyChart is used to connect with patients for Virtual Visits (Telemedicine).  Patients are able to view lab/test results, encounter notes, upcoming appointments, etc.  Non-urgent messages can be sent to your provider as well.   To learn more about what you can do with MyChart, go to [email or web address].    Your next appointment:   As Needed   The format for your next appointment:   In Person  Provider:   Thomasene Ripple, DO     Other Instructions   Your cardiac CT will be scheduled at one of the below locations:   [facility] [address] [place], [place] [address] [phone]  If scheduled at [facility], please arrive at the [facility] and Children's Entrance (Entrance C2) of [facility] 30 minutes prior to test start time. You can use the FREE valet parking offered at entrance C (encouraged to control the heart rate for the test)  Proceed to the [facility] Radiology Department (first floor) to check-in and test prep.  All radiology patients and guests should use entrance C2 at [facility], accessed from [facility], even though the hospital's physical address listed is [address]    On the Night Before the Test: Be sure to Drink plenty of water. Do not consume any caffeinated/decaffeinated beverages or chocolate 12 hours prior to your test. Do not take any antihistamines 12 hours prior to your test.  On the Day of the Test: Drink plenty of water until 1 hour prior to the test. Do not eat any food 1 hour prior to test. You may take your regular medications prior to the test.  Take Ivabradine 10mg  two hours prior to test. FEMALES- please wear underwire-free bra if available, avoid dresses & tight clothing       After the Test: Drink plenty  of water. After receiving IV contrast, you may experience a mild flushed feeling. This is normal. On occasion, you may experience a mild rash up to 24 hours after the test. This is not dangerous. If this occurs, you can take Benadryl 25 mg and increase your fluid intake. If you experience trouble breathing, this can be serious. If it is severe call 911 IMMEDIATELY.  If it is mild, please call our office.  We will call to schedule your test 2-4 weeks out understanding that some insurance companies will need an authorization prior to the service being performed.   For non-scheduling related questions, please contact the cardiac imaging nurse navigator should you have any questions/concerns: Rockwell Alexandria, Cardiac Imaging Nurse Navigator Larey Brick, Cardiac Imaging Nurse Navigator [facility] Direct Office Dial: [phone]   For scheduling needs, including cancellations and rescheduling, please call [place], [phone].    Adopting a Healthy Lifestyle.  Know what a healthy weight is for you (roughly BMI <25) and aim to maintain this   Aim for 7+ servings of fruits and vegetables daily   65-80+ fluid ounces of water or unsweet tea for healthy kidneys   Limit to max 1 drink of alcohol per day; avoid smoking/tobacco   Limit animal fats in diet for cholesterol and heart health - choose grass fed whenever available   Avoid highly processed foods, and foods high in saturated/trans fats   Aim for low stress - take time to unwind and care for your mental health   Aim for 150 min of moderate intensity exercise weekly for heart health, and weights twice weekly for bone health   Aim for 7-9 hours of sleep daily   When it comes to diets, agreement about the perfect plan isnt easy to find, even among the experts. Experts at the [facility] [employer] developed an idea known as the Healthy Eating Plate. Just imagine a plate divided into logical, healthy  portions.   The emphasis is on diet quality:   Load up on vegetables and fruits - one-half of your plate: Aim for color and variety, and remember that potatoes dont count.   Go for whole grains - one-quarter of your plate: Whole wheat, barley, wheat berries, quinoa, oats, brown rice, and foods made with them. If you want pasta, go with whole wheat pasta.   Protein power - one-quarter of your plate: Fish, chicken, beans, and nuts are all healthy, versatile protein sources. Limit red meat.   The diet, however, does go beyond the plate, offering a few other suggestions.   Use healthy plant oils, such as olive, canola, soy, corn, sunflower and peanut. Check the labels, and avoid partially hydrogenated oil, which have unhealthy trans fats.   If youre thirsty, drink water. Coffee and tea are good in moderation, but skip sugary drinks and limit milk and dairy products to one or two daily servings.   The type of carbohydrate in the diet is more important than the amount. Some sources of carbohydrates, such as vegetables, fruits, whole grains, and beans-are healthier than others.   Finally, stay active  Signed, Thomasene Ripple, DO  07/28/2022 2:51 PM    [facility]

## 2022-07-29 ENCOUNTER — Other Ambulatory Visit: Payer: Self-pay | Admitting: Cardiology

## 2022-07-29 LAB — BASIC METABOLIC PANEL
BUN/Creatinine Ratio: 13 (ref 9–23)
BUN: 10 mg/dL (ref 6–24)
CO2: 26 mmol/L (ref 20–29)
Calcium: 9.6 mg/dL (ref 8.7–10.2)
Chloride: 99 mmol/L (ref 96–106)
Creatinine, Ser: 0.76 mg/dL (ref 0.57–1.00)
Glucose: 84 mg/dL (ref 70–99)
Potassium: 4.3 mmol/L (ref 3.5–5.2)
Sodium: 139 mmol/L (ref 134–144)
eGFR: 98 mL/min/{1.73_m2} (ref >59)

## 2022-07-29 NOTE — Telephone Encounter (Signed)
Previous one time order

## 2022-08-12 ENCOUNTER — Ambulatory Visit (HOSPITAL_COMMUNITY): Payer: Federal, State, Local not specified - PPO

## 2022-11-12 ENCOUNTER — Encounter (HOSPITAL_COMMUNITY): Payer: Self-pay
